# Patient Record
Sex: Female | Born: 1965 | Race: White | Hispanic: Refuse to answer | Smoking: Never smoker
Health system: Western US, Academic
[De-identification: ages and names within clinical notes are randomized; demographics above are authoritative.]

## PROBLEM LIST (undated history)

## (undated) DIAGNOSIS — Z8669 Personal history of other diseases of the nervous system and sense organs: Secondary | ICD-10-CM

## (undated) DIAGNOSIS — I10 Essential (primary) hypertension: Secondary | ICD-10-CM

## (undated) DIAGNOSIS — K219 Gastro-esophageal reflux disease without esophagitis: Secondary | ICD-10-CM

## (undated) DIAGNOSIS — J020 Streptococcal pharyngitis: Secondary | ICD-10-CM

## (undated) DIAGNOSIS — F32A Depression, unspecified: Secondary | ICD-10-CM

## (undated) DIAGNOSIS — F329 Major depressive disorder, single episode, unspecified: Secondary | ICD-10-CM

## (undated) DIAGNOSIS — F419 Anxiety disorder, unspecified: Secondary | ICD-10-CM

## (undated) DIAGNOSIS — D649 Anemia, unspecified: Secondary | ICD-10-CM

## (undated) DIAGNOSIS — Z853 Personal history of malignant neoplasm of breast: Secondary | ICD-10-CM

## (undated) DIAGNOSIS — K449 Diaphragmatic hernia without obstruction or gangrene: Secondary | ICD-10-CM

## (undated) DIAGNOSIS — Z86010 Personal history of colonic polyps: Secondary | ICD-10-CM

## (undated) DIAGNOSIS — J00 Acute nasopharyngitis [common cold]: Secondary | ICD-10-CM

## (undated) DIAGNOSIS — J45909 Unspecified asthma, uncomplicated: Secondary | ICD-10-CM

## (undated) DIAGNOSIS — K253 Acute gastric ulcer without hemorrhage or perforation: Secondary | ICD-10-CM

## (undated) HISTORY — DX: Major depressive disorder, single episode, unspecified: F32.9

## (undated) HISTORY — DX: Streptococcal pharyngitis: J02.0

## (undated) HISTORY — DX: Gastro-esophageal reflux disease without esophagitis: K21.9

## (undated) HISTORY — DX: Personal history of other diseases of the nervous system and sense organs: Z86.69

## (undated) HISTORY — DX: Anxiety disorder, unspecified: F41.9

## (undated) HISTORY — PX: TONSILLECTOMY: SUR1361

## (undated) HISTORY — DX: Depression, unspecified: F32.A

## (undated) HISTORY — DX: Essential (primary) hypertension: I10

## (undated) HISTORY — DX: Personal history of colon polyps: Z86.010

## (undated) HISTORY — DX: Anemia, unspecified: D64.9

## (undated) HISTORY — DX: Personal history of malignant neoplasm of breast: Z85.3

## (undated) HISTORY — DX: Acute nasopharyngitis (common cold): J00

## (undated) HISTORY — DX: Unspecified asthma, uncomplicated: J45.909

## (undated) HISTORY — DX: Diaphragmatic hernia without obstruction or gangrene: K44.9

## (undated) HISTORY — DX: Acute gastric ulcer without hemorrhage or perforation: K25.3

---

## 2001-12-09 ENCOUNTER — Encounter: Admission: RE | Admit: 2001-12-09 | Discharge: 2001-12-09 | Payer: Self-pay | Admitting: Family Medicine

## 2001-12-09 ENCOUNTER — Encounter: Payer: Self-pay | Admitting: Family Medicine

## 2005-06-02 ENCOUNTER — Encounter: Admission: RE | Admit: 2005-06-02 | Discharge: 2005-06-02 | Payer: Self-pay | Admitting: Family Medicine

## 2005-06-08 ENCOUNTER — Encounter: Admission: RE | Admit: 2005-06-08 | Discharge: 2005-06-08 | Payer: Self-pay | Admitting: Family Medicine

## 2006-02-12 ENCOUNTER — Encounter: Admission: RE | Admit: 2006-02-12 | Discharge: 2006-02-12 | Payer: Self-pay | Admitting: Obstetrics and Gynecology

## 2007-09-07 ENCOUNTER — Emergency Department (HOSPITAL_BASED_OUTPATIENT_CLINIC_OR_DEPARTMENT_OTHER): Admission: EM | Admit: 2007-09-07 | Discharge: 2007-09-07 | Payer: Self-pay | Admitting: Emergency Medicine

## 2010-02-09 ENCOUNTER — Encounter: Payer: Self-pay | Admitting: Family Medicine

## 2012-05-24 ENCOUNTER — Other Ambulatory Visit: Payer: Self-pay | Admitting: Family Medicine

## 2012-07-21 ENCOUNTER — Other Ambulatory Visit: Payer: Self-pay | Admitting: Family Medicine

## 2012-08-22 ENCOUNTER — Other Ambulatory Visit: Payer: Self-pay | Admitting: Family Medicine

## 2012-09-06 ENCOUNTER — Other Ambulatory Visit: Payer: Self-pay | Admitting: Family Medicine

## 2012-09-20 ENCOUNTER — Ambulatory Visit (INDEPENDENT_AMBULATORY_CARE_PROVIDER_SITE_OTHER): Payer: PRIVATE HEALTH INSURANCE | Admitting: Family Medicine

## 2012-09-20 ENCOUNTER — Encounter: Payer: Self-pay | Admitting: Family Medicine

## 2012-09-20 VITALS — BP 105/69 | HR 118 | Resp 16 | Ht 59.0 in | Wt 130.0 lb

## 2012-09-20 DIAGNOSIS — K219 Gastro-esophageal reflux disease without esophagitis: Secondary | ICD-10-CM

## 2012-09-20 DIAGNOSIS — L708 Other acne: Secondary | ICD-10-CM

## 2012-09-20 DIAGNOSIS — F3289 Other specified depressive episodes: Secondary | ICD-10-CM

## 2012-09-20 DIAGNOSIS — F411 Generalized anxiety disorder: Secondary | ICD-10-CM

## 2012-09-20 DIAGNOSIS — L709 Acne, unspecified: Secondary | ICD-10-CM

## 2012-09-20 DIAGNOSIS — F32A Depression, unspecified: Secondary | ICD-10-CM

## 2012-09-20 DIAGNOSIS — G43909 Migraine, unspecified, not intractable, without status migrainosus: Secondary | ICD-10-CM

## 2012-09-20 DIAGNOSIS — J309 Allergic rhinitis, unspecified: Secondary | ICD-10-CM

## 2012-09-20 DIAGNOSIS — F329 Major depressive disorder, single episode, unspecified: Secondary | ICD-10-CM

## 2012-09-20 MED ORDER — SUMATRIPTAN SUCCINATE 6 MG/0.5ML ~~LOC~~ SOTJ: SUBCUTANEOUS | Status: DC

## 2012-09-20 MED ORDER — NADOLOL 20 MG PO TABS: 20.0000 mg | ORAL_TABLET | Freq: Every day | ORAL | Status: DC

## 2012-09-20 MED ORDER — ESCITALOPRAM OXALATE 20 MG PO TABS: 20.0000 mg | ORAL_TABLET | Freq: Every day | ORAL | Status: DC

## 2012-09-20 MED ORDER — OMEPRAZOLE-SODIUM BICARBONATE 40-1100 MG PO CAPS: 1.0000 | ORAL_CAPSULE | Freq: Every day | ORAL | Status: DC

## 2012-09-20 MED ORDER — MAGNESIUM OXIDE 400 MG PO TABS: 400.0000 mg | ORAL_TABLET | Freq: Every day | ORAL | Status: DC

## 2012-09-20 MED ORDER — FLUTICASONE PROPIONATE 50 MCG/ACT NA SUSP: 2.0000 | Freq: Every day | NASAL | Status: DC

## 2012-09-20 MED ORDER — TRETINOIN 0.1 % EX CREA: TOPICAL_CREAM | Freq: Every day | CUTANEOUS | Status: DC

## 2012-09-20 MED ORDER — SUMATRIPTAN SUCCINATE 100 MG PO TABS: 100.0000 mg | ORAL_TABLET | ORAL | Status: DC | PRN

## 2012-09-20 MED ORDER — WELLBUTRIN XL 300 MG PO TB24: 300.0000 mg | ORAL_TABLET | ORAL | Status: DC

## 2012-09-20 NOTE — Progress Notes (Signed)
  Subjective:    Patient ID: Claire Goodwin, female    DOB: Sep 23, 1965, 47 y.o.   MRN: [id number]  HPI  Claire Goodwin is here today to discuss the conditions listed below:      1)  Anxiety:  She continues taking 1/2 pill of her Lexapro 20 mg and Wellbutrin 300 mg daily.  She would like to get both medications refilled.    2)  Migraine Headaches:  Her headaches are controlled with her magnesium 400 mg and Imitrex 100 mg as needed.  She needs a refill on both medications.      Review of Systems  Constitutional: Negative.   Eyes: Negative.   Respiratory: Negative.   Cardiovascular: Negative.   Gastrointestinal: Negative.   Endocrine: Negative.   Genitourinary: Negative.   Musculoskeletal: Negative.   Skin: Negative.   Allergic/Immunologic: Negative.   Neurological: Positive for headaches.  Hematological: Negative.   Psychiatric/Behavioral: Negative.      Past Medical History  Diagnosis Date  . History of migraine headaches   . Depression   . Anxiety   . Strep throat      Family History  Problem Relation Age of Onset  . Cancer Mother     Breast Cancer  . Cancer Maternal Grandmother     Cervical and Colon Cancer      History   Social History Narrative   Marital Status:  Married Engineer, maintenance (IT))   Children:  G4 P3013  Thurston Hole, Margaretmary Bayley)   Pets:  None    Living Situation: Lives with spouse and children   Occupation:  Pensions consultant    Education: Engineer, maintenance (IT) [employer] ([employer])    Tobacco Use/Exposure:  None    Alcohol Use:  Occasional   Drug Use:  None   Diet:  Regular   Exercise: Treadmill (3-5 x per week)    Hobbies: Reading        Objective:   Physical Exam  Vitals reviewed. Constitutional: She is oriented to person, place, and time. She appears well-developed and well-nourished.  Cardiovascular: Normal rate and regular rhythm.   Pulmonary/Chest: Effort normal and breath sounds normal.  Neurological: She is alert and oriented to person, place, and time.   Skin: Skin is warm and dry.  Psychiatric: She has a normal mood and affect.        Assessment & Plan:

## 2012-09-21 MED ORDER — KETOROLAC TROMETHAMINE 60 MG/2ML IM SOLN
60.0000 mg | Freq: Once | INTRAMUSCULAR | Status: AC
  Administered 2012-09-20: 60 mg via INTRAMUSCULAR

## 2012-09-21 MED ORDER — METHYLPREDNISOLONE SODIUM SUCC 125 MG IJ SOLR
125.0000 mg | Freq: Once | INTRAMUSCULAR | Status: AC
  Administered 2012-09-20: 125 mg via INTRAMUSCULAR

## 2012-11-25 ENCOUNTER — Ambulatory Visit (INDEPENDENT_AMBULATORY_CARE_PROVIDER_SITE_OTHER): Payer: PRIVATE HEALTH INSURANCE | Admitting: Family Medicine

## 2012-11-25 VITALS — BP 128/83 | HR 76 | Temp 97.8°F | Resp 16 | Wt 131.0 lb

## 2012-11-25 DIAGNOSIS — R070 Pain in throat: Secondary | ICD-10-CM

## 2012-11-25 DIAGNOSIS — K1370 Unspecified lesions of oral mucosa: Secondary | ICD-10-CM

## 2012-11-25 DIAGNOSIS — K121 Other forms of stomatitis: Secondary | ICD-10-CM

## 2012-11-25 DIAGNOSIS — Z23 Encounter for immunization: Secondary | ICD-10-CM

## 2012-11-25 LAB — POCT RAPID STREP A (OFFICE): Rapid Strep A Screen: NEGATIVE

## 2012-11-25 MED ORDER — FAMCICLOVIR 500 MG PO TABS: ORAL_TABLET | ORAL | Status: DC

## 2012-11-25 MED ORDER — DOXYCYCLINE HYCLATE 50 MG PO CAPS: 50.0000 mg | ORAL_CAPSULE | Freq: Two times a day (BID) | ORAL | Status: DC

## 2012-11-25 MED ORDER — TRIAMCINOLONE ACETONIDE 0.1 % MT PSTE: 1.0000 "application " | PASTE | Freq: Three times a day (TID) | OROMUCOSAL | Status: DC

## 2012-11-25 NOTE — Progress Notes (Signed)
  Subjective:    Patient ID: Claire Goodwin, female    DOB: 1965-09-02, 47 y.o.   MRN: [id number]  Claire Goodwin is here today complaining of a sore throat.   Sore Throat  This is a new problem. The current episode started today. The problem has been gradually worsening. The pain is worse on the left side. There has been no fever. The pain is at a severity of 4/10. The pain is moderate. She has tried nothing for the symptoms.     Review of Systems  Constitutional: Negative.   HENT: Positive for sore throat.   Eyes: Negative.   Respiratory: Negative.   Cardiovascular: Negative.   Gastrointestinal: Negative.   Endocrine: Negative.   Genitourinary: Negative.   Musculoskeletal: Negative.   Skin: Negative.   Allergic/Immunologic: Negative.   Neurological: Negative.   Hematological: Negative.   Psychiatric/Behavioral: Negative.      Past Medical History  Diagnosis Date  . History of migraine headaches   . Depression   . Anxiety   . Strep throat      Family History  Problem Relation Age of Onset  . Cancer Mother     Breast Cancer  . 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 No distress.  HENT:  Head: Normocephalic.  Small ulcer on the left side of oropharynx.   Eyes: Conjunctivae are normal. Right eye exhibits no discharge. Left eye exhibits no discharge.  Neck: Neck supple.  Cardiovascular: Normal rate, regular rhythm and normal heart sounds.  Exam  reveals no gallop and no friction rub.   No murmur heard. Pulmonary/Chest: Effort normal and breath sounds normal. She has no wheezes. She exhibits no tenderness.  Lymphadenopathy:    She has no cervical adenopathy.  Neurological: She is alert.  Skin: Skin is warm and dry. No rash noted.  Psychiatric: She has a normal mood and affect.      Assessment & Plan:    Claire Goodwin was seen today for sore throat.  Diagnoses and associated orders for this visit:  Throat pain - POCT rapid strep A (Negative)   Oral ulcer - famciclovir (FAMVIR) 500 MG tablet; Take 3 tabs at onset of symptoms x 1 day - triamcinolone (KENALOG) 0.1 % paste; Use as directed 1 application in the mouth or throat 3 (three) times daily. - doxycycline (VIBRAMYCIN) 50 MG capsule; Take 1 capsule (50 mg total) by mouth 2 (two) times daily.  Need for prophylactic vaccination and inoculation against influenza - Flu Vaccine QUAD 36+ mos PF IM (Fluarix)

## 2012-11-25 NOTE — Patient Instructions (Signed)
1)  Mouth Ulcer - Gargle with Duke's Magic Mouthwash and apply the steroid dental paste.  If you start to develop more you can try the Famvir (viral) and /or try the Doxycycline twice a day.    Oral Ulcers Oral ulcers are painful, shallow sores around the lining of the mouth. They can affect the gums, the inside of the lips and the cheeks (sores on the outside of the lips and on the face are different). They typically first occur in school aged children and teenagers. Oral ulcers may also be called canker sores or cold sores. CAUSES  Canker sores and cold sores can be caused by many factors including:  Infection.  Injury.  Sun exposure.  Medications.  Emotional stress.  Food allergies.  Vitamin deficiencies.  Toothpastes containing sodium lauryl sulfate. The Herpes Virus can be the cause of mouth ulcers. The first infection can be severe and cause 10 or more ulcers on the gums, tongue and lips with fever and difficulty in swallowing. This infection usually occurs between the ages of 1 and 3 years.  SYMPTOMS  The typical sore is about  inch (6 mm) in size, is an oval or round ulcer with red borders. DIAGNOSIS  Your caregiver can diagnose simple oral ulcers by examination. Additional testing is usually not required.  TREATMENT  Treatment is aimed at pain relief. Generally, oral ulcers resolve by themselves within 1 to 2 weeks without medication and are not contagious unless caused by Herpes (and other viruses). Antibiotics are not effective with mouth sores. Avoid direct contact with others until the ulcer is completely healed. See your caregiver for follow-up care as recommended. Also:  Offer a soft diet.  Encourage plenty of fluids to prevent dehydration. Popsicles and milk shakes can be helpful.  Avoid acidic and salty foods and drinks such as orange juice.  Infants and young children will often refuse to drink because of pain. Using a teaspoon, cup or syringe to give small  amounts of fluids frequently can help prevent dehydration.  Cold compresses on the face may help reduce pain.  Pain medication can help control soreness.  A solution of diphenhydramine mixed with a liquid antacid can be useful to decrease the soreness of ulcers. Consult a caregiver for the dosing.  Liquids or ointments with a numbing ingredient may be helpful when used as recommended.  Older children and teenagers can rinse their mouth with a salt-water mixture (1/2 teaspoonof salt in 8 ounces of water) four times a day. This treatment is uncomfortable but may reduce the time the ulcers are present.  There are many over the counter throat lozenges and medications available for oral ulcers. There effectiveness has not been studied.  Consult your medical caregiver prior to using homeopathic treatments for oral ulcers. SEEK MEDICAL CARE IF:   You think your child needs to be seen.  The pain worsens and you cannot control it.  There are 4 or more ulcers.  The lips and gums begin to bleed and crust.  A single mouth ulcer is near a tooth that is causing a toothache or pain.  Your child has a fever, swollen face, or swollen glands.  The ulcers began after starting a medication.  Mouth ulcers keep re-occurring or last more than 2 weeks.  You think your child is not taking adequate fluids. SEEK IMMEDIATE MEDICAL CARE IF:   Your child has a high fever.  Your child is unable to swallow or becomes dehydrated.  Your child looks  or acts very ill.  An ulcer caused by a chemical your child accidentally put in their mouth. Document Released: 02/13/2004 Document Revised: 03/30/2011 Document Reviewed: 09/27/2008 [facility] Patient Information 2014 [place], [place].

## 2012-11-26 DIAGNOSIS — F32A Depression, unspecified: Secondary | ICD-10-CM | POA: Insufficient documentation

## 2012-11-26 DIAGNOSIS — K219 Gastro-esophageal reflux disease without esophagitis: Secondary | ICD-10-CM | POA: Insufficient documentation

## 2012-11-26 DIAGNOSIS — J309 Allergic rhinitis, unspecified: Secondary | ICD-10-CM | POA: Insufficient documentation

## 2012-11-26 DIAGNOSIS — L709 Acne, unspecified: Secondary | ICD-10-CM | POA: Insufficient documentation

## 2012-11-26 DIAGNOSIS — F329 Major depressive disorder, single episode, unspecified: Secondary | ICD-10-CM | POA: Insufficient documentation

## 2012-11-26 DIAGNOSIS — G43909 Migraine, unspecified, not intractable, without status migrainosus: Secondary | ICD-10-CM | POA: Insufficient documentation

## 2012-11-26 DIAGNOSIS — F411 Generalized anxiety disorder: Secondary | ICD-10-CM | POA: Insufficient documentation

## 2012-11-26 DIAGNOSIS — G43109 Migraine with aura, not intractable, without status migrainosus: Secondary | ICD-10-CM | POA: Insufficient documentation

## 2012-11-26 NOTE — Assessment & Plan Note (Signed)
Refilled her Zegerid.

## 2012-11-26 NOTE — Assessment & Plan Note (Signed)
Refilled her Flonase.

## 2012-11-26 NOTE — Assessment & Plan Note (Signed)
Refilled her Lexapro.   

## 2012-11-26 NOTE — Assessment & Plan Note (Signed)
Refilled her nadolol, magnesium and Imitrex.  She was also given injections of Solu-Medrol and Toradol at today's visit to help break the migraine she has been suffering from over the past few days.

## 2012-11-26 NOTE — Assessment & Plan Note (Signed)
Refilled her Wellbutrin.   

## 2012-11-26 NOTE — Assessment & Plan Note (Signed)
Refilled her Retin A.

## 2013-01-16 LAB — HM MAMMOGRAPHY: HM Mammogram: NEGATIVE

## 2013-01-18 ENCOUNTER — Encounter: Payer: Self-pay | Admitting: *Deleted

## 2013-01-22 ENCOUNTER — Encounter: Payer: Self-pay | Admitting: Family Medicine

## 2013-01-22 DIAGNOSIS — R070 Pain in throat: Secondary | ICD-10-CM | POA: Insufficient documentation

## 2013-01-22 DIAGNOSIS — K121 Other forms of stomatitis: Secondary | ICD-10-CM | POA: Insufficient documentation

## 2013-01-22 DIAGNOSIS — Z23 Encounter for immunization: Secondary | ICD-10-CM | POA: Insufficient documentation

## 2013-03-13 ENCOUNTER — Other Ambulatory Visit: Payer: Self-pay | Admitting: *Deleted

## 2013-03-13 DIAGNOSIS — Z0000 Encounter for general adult medical examination without abnormal findings: Secondary | ICD-10-CM

## 2013-03-14 ENCOUNTER — Other Ambulatory Visit: Payer: PRIVATE HEALTH INSURANCE

## 2013-03-15 ENCOUNTER — Other Ambulatory Visit: Payer: PRIVATE HEALTH INSURANCE

## 2013-03-15 LAB — LIPID PANEL
Cholesterol: 189 mg/dL (ref 0–200)
HDL: 28 mg/dL — ABNORMAL LOW (ref >39)
LDL Cholesterol: 117 mg/dL — ABNORMAL HIGH (ref 0–99)
Total CHOL/HDL Ratio: 6.8 Ratio
Triglycerides: 221 mg/dL — ABNORMAL HIGH (ref <150)
VLDL: 44 mg/dL — ABNORMAL HIGH (ref 0–40)

## 2013-03-15 LAB — CBC WITH DIFFERENTIAL/PLATELET
Basophils Absolute: 0 10*3/uL (ref 0.0–0.1)
Basophils Relative: 0 % (ref 0–1)
Eosinophils Absolute: 0.3 10*3/uL (ref 0.0–0.7)
Eosinophils Relative: 3 % (ref 0–5)
HCT: 39.4 % (ref 36.0–46.0)
Hemoglobin: 13.5 g/dL (ref 12.0–15.0)
Lymphocytes Relative: 33 % (ref 12–46)
Lymphs Abs: 3.1 10*3/uL (ref 0.7–4.0)
MCH: 30.1 pg (ref 26.0–34.0)
MCHC: 34.3 g/dL (ref 30.0–36.0)
MCV: 87.8 fL (ref 78.0–100.0)
Monocytes Absolute: 0.6 10*3/uL (ref 0.1–1.0)
Monocytes Relative: 6 % (ref 3–12)
Neutro Abs: 5.4 10*3/uL (ref 1.7–7.7)
Neutrophils Relative %: 58 % (ref 43–77)
Platelets: 368 10*3/uL (ref 150–400)
RBC: 4.49 MIL/uL (ref 3.87–5.11)
RDW: 13.1 % (ref 11.5–15.5)
WBC: 9.3 10*3/uL (ref 4.0–10.5)

## 2013-03-15 LAB — COMPLETE METABOLIC PANEL WITH GFR
ALT: 31 U/L (ref 0–35)
AST: 21 U/L (ref 0–37)
Albumin: 3.8 g/dL (ref 3.5–5.2)
Alkaline Phosphatase: 38 U/L — ABNORMAL LOW (ref 39–117)
BUN: 12 mg/dL (ref 6–23)
CO2: 22 mEq/L (ref 19–32)
Calcium: 8.8 mg/dL (ref 8.4–10.5)
Chloride: 107 mEq/L (ref 96–112)
Creat: 0.67 mg/dL (ref 0.50–1.10)
GFR, Est African American: >89 mL/min
GFR, Est Non African American: >89 mL/min
Glucose, Bld: 82 mg/dL (ref 70–99)
Potassium: 4.4 mEq/L (ref 3.5–5.3)
Sodium: 138 mEq/L (ref 135–145)
Total Bilirubin: 0.4 mg/dL (ref 0.2–1.2)
Total Protein: 6.1 g/dL (ref 6.0–8.3)

## 2013-03-16 LAB — TSH: TSH: 1.224 u[IU]/mL (ref 0.350–4.500)

## 2013-03-21 ENCOUNTER — Ambulatory Visit (INDEPENDENT_AMBULATORY_CARE_PROVIDER_SITE_OTHER): Payer: PRIVATE HEALTH INSURANCE | Admitting: Family Medicine

## 2013-03-21 ENCOUNTER — Encounter: Payer: Self-pay | Admitting: Family Medicine

## 2013-03-21 VITALS — BP 113/70 | HR 72 | Resp 16 | Ht 59.0 in | Wt 127.0 lb

## 2013-03-21 DIAGNOSIS — L709 Acne, unspecified: Secondary | ICD-10-CM

## 2013-03-21 DIAGNOSIS — R059 Cough, unspecified: Secondary | ICD-10-CM

## 2013-03-21 DIAGNOSIS — R05 Cough: Secondary | ICD-10-CM

## 2013-03-21 DIAGNOSIS — R0989 Other specified symptoms and signs involving the circulatory and respiratory systems: Secondary | ICD-10-CM

## 2013-03-21 DIAGNOSIS — L708 Other acne: Secondary | ICD-10-CM

## 2013-03-21 MED ORDER — MUPIROCIN 2 % EX OINT: TOPICAL_OINTMENT | CUTANEOUS | Status: DC

## 2013-03-21 MED ORDER — HYDROCOD POLST-CHLORPHEN POLST 10-8 MG/5ML PO LQCR: 5.0000 mL | Freq: Two times a day (BID) | ORAL | Status: DC | PRN

## 2013-03-21 MED ORDER — BENZONATATE 200 MG PO CAPS: 200.0000 mg | ORAL_CAPSULE | Freq: Three times a day (TID) | ORAL | Status: DC | PRN

## 2013-03-21 MED ORDER — AZITHROMYCIN 500 MG PO TABS: 500.0000 mg | ORAL_TABLET | Freq: Every day | ORAL | Status: DC

## 2013-03-21 NOTE — Patient Instructions (Addendum)
1)  Head Congestion - Zyrtec D twice a day  2)  Cough/Chest Congestion - Mucinex DM 1200/60 twice a day plus Tessalon Perles plus Tussionex 1 tsp at night; Umcka Cold Care - 2 droppers 3-4 x per day.       Cough, Adult  A cough is a reflex that helps clear your throat and airways. It can help heal the body or may be a reaction to an irritated airway. A cough may only last 2 or 3 weeks (acute) or may last more than 8 weeks (chronic).  CAUSES Acute cough:  Viral or bacterial infections. Chronic cough:  Infections.  Allergies.  Asthma.  Post-nasal drip.  Smoking.  Heartburn or acid reflux.  Some medicines.  Chronic lung problems (COPD).  Cancer. SYMPTOMS   Cough.  Fever.  Chest pain.  Increased breathing rate.  High-pitched whistling sound when breathing (wheezing).  Colored mucus that you cough up (sputum). TREATMENT   A bacterial cough may be treated with antibiotic medicine.  A viral cough must run its course and will not respond to antibiotics.  Your caregiver may recommend other treatments if you have a chronic cough. HOME CARE INSTRUCTIONS   Only take over-the-counter or prescription medicines for pain, discomfort, or fever as directed by your caregiver. Use cough suppressants only as directed by your caregiver.  Use a cold steam vaporizer or humidifier in your bedroom or home to help loosen secretions.  Sleep in a semi-upright position if your cough is worse at night.  Rest as needed.  Stop smoking if you smoke. SEEK IMMEDIATE MEDICAL CARE IF:   You have pus in your sputum.  Your cough starts to worsen.  You cannot control your cough with suppressants and are losing sleep.  You begin coughing up blood.  You have difficulty breathing.  You develop pain which is getting worse or is uncontrolled with medicine.  You have a fever. MAKE SURE YOU:   Understand these instructions.  Will watch your condition.  Will get help right away if  you are not doing well or get worse. Document Released: 07/04/2010 Document Revised: 03/30/2011 Document Reviewed: 07/04/2010 [facility] Patient Information 2014 [place]

## 2013-03-21 NOTE — Progress Notes (Signed)
Subjective:    Patient ID: Claire Goodwin, female    DOB: Feb 11, 1965, 48 y.o.   MRN: [id number]  HPI  Claire Goodwin is here today to discuss her lab result and to get an  insurance form completed for her health insurance.  She would also like to discuss the URI symptoms she has been having for the past 3 weeks.  She has struggled with head and chest congestion along with a cough.  She has taken/used  Mucinex, nasal spray and pseudoephedrine which as not helped.  She feels that she is just not getting better.    Review of Systems  HENT: Positive for congestion, rhinorrhea, sinus pressure and voice change.   Respiratory: Positive for cough.   Neurological: Positive for dizziness. Negative for headaches.  Psychiatric/Behavioral: The patient is not nervous/anxious.   All other systems reviewed and are negative.    Past Medical History  Diagnosis Date  . History of migraine headaches   . Depression   . Anxiety   . Strep throat   . Hypertension   . GERD (gastroesophageal reflux disease)      Past Surgical History  Procedure Laterality Date  . Tonsillectomy    . 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 Cancer Mother     Breast Cancer  . Breast cancer Mother   . Cancer Maternal Grandmother     Cervical and Colon Cancer      Current Outpatient Prescriptions on File Prior to Visit  Medication Sig Dispense Refill  . fluticasone (FLONASE) 50 MCG/ACT nasal spray Place 2 sprays into the nose daily.  17 g  11  .  Levonorgestrel-Ethinyl Estradiol (SEASONIQUE) 0.15-0.03 &0.01 MG tablet Take 1 tablet by mouth daily.      . magnesium oxide (MAG-OX) 400 MG tablet Take 1 tablet (400 mg total) by mouth daily.  30 tablet  11  . nadolol (CORGARD) 20 MG tablet Take 1 tablet (20 mg total) by mouth daily.  30 tablet  11  . omeprazole-sodium bicarbonate (ZEGERID) 40-1100 MG per capsule Take 1 capsule by mouth daily before breakfast.  30 capsule  11  . SUMAtriptan (IMITREX) 100 MG tablet Take 1 tablet (100 mg total) by mouth every 2 (two) hours as needed for migraine.  9 tablet  11  . SUMAtriptan Succinate 6 MG/0.5ML SOTJ Use 1 device in place of a pill as directed for migraine headache.  6 Device  11  . tretinoin (RETIN-A) 0.1 % cream Apply topically at bedtime.  45 g  11   No current facility-administered medications on file prior to visit.     Allergies  Allergen Reactions  . Sulfa Antibiotics Rash     Immunization History  Administered Date(s) Administered  . Influenza,inj,Quad PF,36+ Mos 11/25/2012  . MMR 09/11/2004  . Td 09/11/2004        Objective:  Physical Exam  Nursing note and vitals reviewed. Constitutional: She is oriented to person, place, and time. She appears well-developed and well-nourished. No distress.  HENT:  Head: Normocephalic.  Mouth/Throat: No oropharyngeal exudate.  Eyes: Conjunctivae are normal. Right eye exhibits no discharge. Left eye exhibits no discharge.  Neck: Neck supple.  Cardiovascular: Normal rate, regular rhythm and normal heart sounds.  Exam reveals no gallop and no friction rub.   No murmur heard. Pulmonary/Chest: Effort normal and breath sounds normal. She has no wheezes. She exhibits no tenderness.  Lymphadenopathy:    She has no cervical adenopathy.  Neurological: She is alert and oriented to person, place, and time.  Skin: Skin is warm and dry. No rash noted.  Acne is present on face.    Psychiatric: She has a normal mood and affect.        Assessment & Plan:    Claire Goodwin was seen today for uri.  Diagnoses and associated orders for this visit:  Cough - benzonatate (TESSALON) 200 MG capsule; Take 1 capsule (200 mg total) by mouth 3 (three) times daily as needed for cough. - chlorpheniramine-HYDROcodone (TUSSIONEX PENNKINETIC ER) 10-8 MG/5ML LQCR; Take 5 mLs by mouth every 12 (twelve) hours as needed. - azithromycin (ZITHROMAX) 500 MG tablet; Take 1 tablet (500 mg total) by mouth daily.  Chest congestion  Acne - mupirocin ointment (BACTROBAN) 2 %; Apply to skin BID

## 2013-03-25 ENCOUNTER — Other Ambulatory Visit: Payer: Self-pay | Admitting: Family Medicine

## 2013-03-27 ENCOUNTER — Other Ambulatory Visit: Payer: Self-pay | Admitting: *Deleted

## 2013-03-27 DIAGNOSIS — F411 Generalized anxiety disorder: Secondary | ICD-10-CM

## 2013-03-27 DIAGNOSIS — F329 Major depressive disorder, single episode, unspecified: Secondary | ICD-10-CM

## 2013-03-27 DIAGNOSIS — F32A Depression, unspecified: Secondary | ICD-10-CM

## 2013-03-27 MED ORDER — WELLBUTRIN XL 300 MG PO TB24: 300.0000 mg | ORAL_TABLET | ORAL | Status: DC

## 2013-03-27 MED ORDER — ESCITALOPRAM OXALATE 20 MG PO TABS: 20.0000 mg | ORAL_TABLET | Freq: Every day | ORAL | Status: DC

## 2013-08-22 ENCOUNTER — Encounter: Payer: Self-pay | Admitting: Family Medicine

## 2013-08-22 ENCOUNTER — Ambulatory Visit (INDEPENDENT_AMBULATORY_CARE_PROVIDER_SITE_OTHER): Payer: PRIVATE HEALTH INSURANCE | Admitting: Family Medicine

## 2013-08-22 ENCOUNTER — Other Ambulatory Visit: Payer: Self-pay | Admitting: Family Medicine

## 2013-08-22 VITALS — BP 104/71 | HR 66 | Resp 16 | Ht 59.0 in | Wt 130.0 lb

## 2013-08-22 DIAGNOSIS — G43909 Migraine, unspecified, not intractable, without status migrainosus: Secondary | ICD-10-CM

## 2013-08-22 DIAGNOSIS — L708 Other acne: Secondary | ICD-10-CM

## 2013-08-22 DIAGNOSIS — R110 Nausea: Secondary | ICD-10-CM

## 2013-08-22 DIAGNOSIS — F32A Depression, unspecified: Secondary | ICD-10-CM

## 2013-08-22 DIAGNOSIS — I10 Essential (primary) hypertension: Secondary | ICD-10-CM

## 2013-08-22 DIAGNOSIS — L700 Acne vulgaris: Secondary | ICD-10-CM

## 2013-08-22 DIAGNOSIS — F329 Major depressive disorder, single episode, unspecified: Secondary | ICD-10-CM

## 2013-08-22 DIAGNOSIS — F3289 Other specified depressive episodes: Secondary | ICD-10-CM

## 2013-08-22 DIAGNOSIS — K219 Gastro-esophageal reflux disease without esophagitis: Secondary | ICD-10-CM

## 2013-08-22 DIAGNOSIS — F411 Generalized anxiety disorder: Secondary | ICD-10-CM

## 2013-08-22 DIAGNOSIS — J3089 Other allergic rhinitis: Secondary | ICD-10-CM

## 2013-08-22 MED ORDER — PROMETHAZINE HCL 25 MG PO TABS: 25.0000 mg | ORAL_TABLET | Freq: Three times a day (TID) | ORAL | Status: DC | PRN

## 2013-08-22 MED ORDER — OMEPRAZOLE-SODIUM BICARBONATE 40-1100 MG PO CAPS: 1.0000 | ORAL_CAPSULE | Freq: Every day | ORAL | Status: DC

## 2013-08-22 MED ORDER — SUMATRIPTAN SUCCINATE 100 MG PO TABS: 100.0000 mg | ORAL_TABLET | ORAL | Status: DC | PRN

## 2013-08-22 MED ORDER — NADOLOL 20 MG PO TABS: 20.0000 mg | ORAL_TABLET | Freq: Every day | ORAL | Status: DC

## 2013-08-22 MED ORDER — MAGNESIUM OXIDE 400 MG PO TABS: 400.0000 mg | ORAL_TABLET | Freq: Every day | ORAL | Status: AC

## 2013-08-22 MED ORDER — TRETINOIN 0.1 % EX CREA: TOPICAL_CREAM | Freq: Every day | CUTANEOUS | Status: AC

## 2013-08-22 MED ORDER — SUMATRIPTAN SUCCINATE 6 MG/0.5ML ~~LOC~~ SOTJ: SUBCUTANEOUS | Status: DC

## 2013-08-22 MED ORDER — ESCITALOPRAM OXALATE 20 MG PO TABS: 20.0000 mg | ORAL_TABLET | Freq: Every day | ORAL | Status: DC

## 2013-08-22 MED ORDER — FLUTICASONE PROPIONATE 50 MCG/ACT NA SUSP: 2.0000 | Freq: Every day | NASAL | Status: AC

## 2013-08-22 MED ORDER — SUMATRIPTAN-NAPROXEN SODIUM 85-500 MG PO TABS: 1.0000 | ORAL_TABLET | ORAL | Status: DC | PRN

## 2013-08-22 MED ORDER — BUPROPION HCL ER (XL) 300 MG PO TB24: 300.0000 mg | ORAL_TABLET | ORAL | Status: DC

## 2013-08-22 NOTE — Progress Notes (Signed)
Subjective:    Patient ID: Claire Goodwin, female    DOB: [birth year], 48 y.o.   MRN: [id number]  HPI  Claire Goodwin is here today to get medication refills. She has been doing well since her last visit.  1)  Anxiety:  Her mood is good on the combination of Lexapro and Wellbutrin.    2)  Migraines:  She would like to get a refill on her Treximet and Imitrex.   3)  GERD:  Her reflux is controlled with Zegrid.  4)  Hypertension:  She is doing well on the nadolol.     Review of Systems  Constitutional: Negative for activity change, appetite change, fatigue and unexpected weight change.  HENT: Negative.   Eyes: Negative.   Respiratory: Negative for shortness of breath.   Cardiovascular: Negative for chest pain, palpitations and leg swelling.  Gastrointestinal: Negative for diarrhea and constipation.  Endocrine: Negative.   Genitourinary: Negative for difficulty urinating.  Musculoskeletal: Negative.   Skin: Negative.   Neurological: Negative.  Negative for headaches.  Hematological: Negative for adenopathy. Does not bruise/bleed easily.  Psychiatric/Behavioral: Negative for behavioral problems, sleep disturbance and dysphoric mood. The patient is not nervous/anxious.   All other systems reviewed and are negative.    Past Medical History  Diagnosis Date  . History of migraine headaches   . Depression   . Anxiety   . Strep throat   . Hypertension   . GERD (gastroesophageal reflux disease)      Past Surgical History  Procedure Laterality Date  . Tonsillectomy    . 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 Cancer Mother     Breast Cancer  . Breast cancer Mother   . Cancer Maternal Grandmother     Cervical and Colon Cancer      Current Outpatient Prescriptions on File Prior to Visit  Medication Sig Dispense Refill  . Levonorgestrel-Ethinyl Estradiol (SEASONIQUE) 0.15-0.03 &0.01 MG tablet Take 1 tablet by mouth daily.       No current facility-administered medications on file prior to visit.     Allergies  Allergen Reactions  . Sulfa Antibiotics Rash     Immunization History  Administered Date(s) Administered  . Influenza,inj,Quad PF,36+ Mos 11/25/2012  . MMR 09/11/2004  . Td 09/11/2004       Objective:   Physical Exam  Vitals reviewed. Constitutional: She is oriented to person, place, and time. She appears well-developed and well-nourished. No distress.  HENT:  Head: Normocephalic and atraumatic.  Mouth/Throat: Oropharynx is clear and moist.  Eyes: Conjunctivae and EOM are normal. Pupils are equal, round, and reactive to light. Right eye exhibits no discharge. Left eye exhibits no discharge. No scleral icterus.  Neck: Normal range of motion. Neck supple. No thyromegaly present.  Cardiovascular: Normal rate, regular rhythm and normal heart sounds.   Pulmonary/Chest: Effort normal and breath  sounds normal. No respiratory distress.  Abdominal: There is no tenderness.  Musculoskeletal: She exhibits no edema and no tenderness.  Lymphadenopathy:    She has no cervical adenopathy.  Neurological: She is alert and oriented to person, place, and time.  Skin: Skin is warm and dry.  Psychiatric: She has a normal mood and affect.      Assessment & Plan:    Claire Goodwin was seen today for medication management and medical management of chronic issues.  Her symptoms are controlled on her current medications so they were refilled.    Diagnoses and associated orders for this  visit:  Essential hypertension, benign - nadolol (CORGARD) 20 MG tablet; Take 1 tablet (20 mg total) by mouth daily.  Anxiety state, unspecified - escitalopram (LEXAPRO) 20 MG tablet; Take 1 tablet (20 mg total) by mouth daily.  Gastroesophageal reflux disease without esophagitis - omeprazole-sodium bicarbonate (ZEGERID) 40-1100 MG per capsule; Take 1 capsule by mouth daily before breakfast.  Depression - buPROPion (WELLBUTRIN XL) 300 MG 24 hr tablet; Take 1 tablet (300 mg total) by mouth every morning.  Acne vulgaris - tretinoin (RETIN-A) 0.1 % cream; Apply topically at bedtime.  Migraine, unspecified, without mention of intractable migraine without mention of status migrainosus - SUMAtriptan-naproxen (TREXIMET) 85-500 MG per tablet; Take 1 tablet by mouth every 2 (two) hours as needed for migraine. - SUMAtriptan Succinate 6 MG/0.5ML SOTJ; Use 1 device in place of a pill as directed for migraine headache. - SUMAtriptan (IMITREX) 100 MG tablet; Take 1 tablet (100 mg total) by mouth every 2 (two) hours as needed for migraine. - nadolol (CORGARD) 20 MG tablet; Take 1 tablet (20 mg total) by mouth daily. - magnesium oxide (MAG-OX) 400 MG tablet; Take 1 tablet (400 mg total) by mouth daily.  Other allergic rhinitis - fluticasone (FLONASE) 50 MCG/ACT nasal spray; Place 2 sprays into both nostrils daily.  Nausea alone - promethazine (PHENERGAN) 25 MG tablet; Take 1 tablet (25 mg total) by mouth every 8 (eight) hours as needed for nausea or vomiting.   TIME SPENT "FACE TO FACE" WITH PATIENT -  40 MINS

## 2013-08-22 NOTE — Patient Instructions (Addendum)
Insomnia Insomnia is frequent trouble falling and/or staying asleep. Insomnia can be a long term problem or a short term problem. Both are common. Insomnia can be a short term problem when the wakefulness is related to a certain stress or worry. Long term insomnia is often related to ongoing stress during waking hours and/or poor sleeping habits. Overtime, sleep deprivation itself can make the problem worse. Every little thing feels more severe because you are overtired and your ability to cope is decreased. CAUSES   Stress, anxiety, and depression.  Poor sleeping habits.  Distractions such as TV in the bedroom.  Naps close to bedtime.  Engaging in emotionally charged conversations before bed.  Technical reading before sleep.  Alcohol and other sedatives. They may make the problem worse. They can hurt normal sleep patterns and normal dream activity.  Stimulants such as caffeine for several hours prior to bedtime.  Pain syndromes and shortness of breath can cause insomnia.  Exercise late at night.  Changing time zones may cause sleeping problems (jet lag). It is sometimes helpful to have someone observe your sleeping patterns. They should look for periods of not breathing during the night (sleep apnea). They should also look to see how long those periods last. If you live alone or observers are uncertain, you can also be observed at a sleep clinic where your sleep patterns will be professionally monitored. Sleep apnea requires a checkup and treatment. Give your caregivers your medical history. Give your caregivers observations your family has made about your sleep.  SYMPTOMS   Not feeling rested in the morning.  Anxiety and restlessness at bedtime.  Difficulty falling and staying asleep. TREATMENT   Your caregiver may prescribe treatment for an underlying medical disorders. Your caregiver can give advice or help if you are using alcohol or other drugs for self-medication. Treatment  of underlying problems will usually eliminate insomnia problems.  Medications can be prescribed for short time use. They are generally not recommended for lengthy use.  Over-the-counter sleep medicines are not recommended for lengthy use. They can be habit forming.  You can promote easier sleeping by making lifestyle changes such as:  Using relaxation techniques that help with breathing and reduce muscle tension.  Exercising earlier in the day.  Changing your diet and the time of your last meal. No night time snacks.  Establish a regular time to go to bed.  Counseling can help with stressful problems and worry.  Soothing music and white noise may be helpful if there are background noises you cannot remove.  Stop tedious detailed work at least one hour before bedtime. HOME CARE INSTRUCTIONS   Keep a diary. Inform your caregiver about your progress. This includes any medication side effects. See your caregiver regularly. Take note of:  Times when you are asleep.  Times when you are awake during the night.  The quality of your sleep.  How you feel the next day. This information will help your caregiver care for you.  Get out of bed if you are still awake after 15 minutes. Read or do some quiet activity. Keep the lights down. Wait until you feel sleepy and go back to bed.  Keep regular sleeping and waking hours. Avoid naps.  Exercise regularly.  Avoid distractions at bedtime. Distractions include watching television or engaging in any intense or detailed activity like attempting to balance the household checkbook.  Develop a bedtime ritual. Keep a familiar routine of bathing, brushing your teeth, climbing into bed at the same   time each night, listening to soothing music. Routines increase the success of falling to sleep faster.  Use relaxation techniques. This can be using breathing and muscle tension release routines. It can also include visualizing peaceful scenes. You can  also help control troubling or intruding thoughts by keeping your mind occupied with boring or repetitive thoughts like the old concept of counting sheep. You can make it more creative like imagining planting one beautiful flower after another in your backyard garden.  During your day, work to eliminate stress. When this is not possible use some of the previous suggestions to help reduce the anxiety that accompanies stressful situations. MAKE SURE YOU:   Understand these instructions.  Will watch your condition.  Will get help right away if you are not doing well or get worse. Document Released: 01/03/2000 Document Revised: 03/30/2011 Document Reviewed: 02/02/2007 Exit[employer] Patient Information 2015 [employer]. This information is not intended to replace advice given to you by your health care provider. Make sure you discuss any questions you have with your health care provider.  

## 2013-09-23 ENCOUNTER — Other Ambulatory Visit: Payer: Self-pay | Admitting: Family Medicine

## 2013-09-28 ENCOUNTER — Other Ambulatory Visit: Payer: Self-pay | Admitting: Family Medicine

## 2017-02-18 ENCOUNTER — Other Ambulatory Visit (INDEPENDENT_AMBULATORY_CARE_PROVIDER_SITE_OTHER): Payer: Self-pay | Admitting: Otolaryngology

## 2017-02-18 DIAGNOSIS — J329 Chronic sinusitis, unspecified: Secondary | ICD-10-CM

## 2018-06-09 ENCOUNTER — Encounter (HOSPITAL_COMMUNITY): Payer: Self-pay

## 2018-06-09 ENCOUNTER — Emergency Department (HOSPITAL_COMMUNITY): Payer: PRIVATE HEALTH INSURANCE

## 2018-06-09 ENCOUNTER — Inpatient Hospital Stay (HOSPITAL_COMMUNITY)
Admission: EM | Admit: 2018-06-09 | Discharge: 2018-06-12 | DRG: 418 | Disposition: A | Discharge status: Home / Self Care | Payer: PRIVATE HEALTH INSURANCE | Attending: Surgery | Admitting: Surgery

## 2018-06-09 ENCOUNTER — Other Ambulatory Visit: Payer: Self-pay

## 2018-06-09 DIAGNOSIS — K8066 Calculus of gallbladder and bile duct with acute and chronic cholecystitis without obstruction: Secondary | ICD-10-CM | POA: Diagnosis not present

## 2018-06-09 DIAGNOSIS — Z419 Encounter for procedure for purposes other than remedying health state, unspecified: Secondary | ICD-10-CM

## 2018-06-09 DIAGNOSIS — F329 Major depressive disorder, single episode, unspecified: Secondary | ICD-10-CM | POA: Diagnosis present

## 2018-06-09 DIAGNOSIS — K420 Umbilical hernia with obstruction, without gangrene: Secondary | ICD-10-CM | POA: Diagnosis present

## 2018-06-09 DIAGNOSIS — K8010 Calculus of gallbladder with chronic cholecystitis without obstruction: Secondary | ICD-10-CM | POA: Diagnosis present

## 2018-06-09 DIAGNOSIS — K838 Other specified diseases of biliary tract: Secondary | ICD-10-CM

## 2018-06-09 DIAGNOSIS — Z7951 Long term (current) use of inhaled steroids: Secondary | ICD-10-CM

## 2018-06-09 DIAGNOSIS — Z882 Allergy status to sulfonamides status: Secondary | ICD-10-CM

## 2018-06-09 DIAGNOSIS — E785 Hyperlipidemia, unspecified: Secondary | ICD-10-CM | POA: Diagnosis present

## 2018-06-09 DIAGNOSIS — R748 Abnormal levels of other serum enzymes: Secondary | ICD-10-CM | POA: Diagnosis present

## 2018-06-09 DIAGNOSIS — J302 Other seasonal allergic rhinitis: Secondary | ICD-10-CM | POA: Diagnosis present

## 2018-06-09 DIAGNOSIS — R1011 Right upper quadrant pain: Secondary | ICD-10-CM | POA: Diagnosis not present

## 2018-06-09 DIAGNOSIS — Z98891 History of uterine scar from previous surgery: Secondary | ICD-10-CM

## 2018-06-09 DIAGNOSIS — Z881 Allergy status to other antibiotic agents status: Secondary | ICD-10-CM

## 2018-06-09 DIAGNOSIS — K5900 Constipation, unspecified: Secondary | ICD-10-CM | POA: Diagnosis present

## 2018-06-09 DIAGNOSIS — F419 Anxiety disorder, unspecified: Secondary | ICD-10-CM | POA: Diagnosis present

## 2018-06-09 DIAGNOSIS — K8020 Calculus of gallbladder without cholecystitis without obstruction: Secondary | ICD-10-CM

## 2018-06-09 DIAGNOSIS — K219 Gastro-esophageal reflux disease without esophagitis: Secondary | ICD-10-CM | POA: Diagnosis present

## 2018-06-09 DIAGNOSIS — Z1159 Encounter for screening for other viral diseases: Secondary | ICD-10-CM

## 2018-06-09 DIAGNOSIS — Z79899 Other long term (current) drug therapy: Secondary | ICD-10-CM

## 2018-06-09 DIAGNOSIS — I10 Essential (primary) hypertension: Secondary | ICD-10-CM | POA: Diagnosis present

## 2018-06-09 DIAGNOSIS — K8050 Calculus of bile duct without cholangitis or cholecystitis without obstruction: Secondary | ICD-10-CM | POA: Diagnosis present

## 2018-06-09 LAB — COMPREHENSIVE METABOLIC PANEL
ALT: 53 U/L — ABNORMAL HIGH (ref 0–44)
AST: 88 U/L — ABNORMAL HIGH (ref 15–41)
Albumin: 3.8 g/dL (ref 3.5–5.0)
Alkaline Phosphatase: 73 U/L (ref 38–126)
Anion gap: 8 (ref 5–15)
BUN: 13 mg/dL (ref 6–20)
CO2: 21 mmol/L — ABNORMAL LOW (ref 22–32)
Calcium: 8.5 mg/dL — ABNORMAL LOW (ref 8.9–10.3)
Chloride: 109 mmol/L (ref 98–111)
Creatinine, Ser: 0.86 mg/dL (ref 0.44–1.00)
GFR calc Af Amer: >60 mL/min (ref >60)
GFR calc non Af Amer: >60 mL/min (ref >60)
Glucose, Bld: 90 mg/dL (ref 70–99)
Potassium: 3.7 mmol/L (ref 3.5–5.1)
Sodium: 138 mmol/L (ref 135–145)
Total Bilirubin: 1.5 mg/dL — ABNORMAL HIGH (ref 0.3–1.2)
Total Protein: 6.5 g/dL (ref 6.5–8.1)

## 2018-06-09 LAB — CBC WITH DIFFERENTIAL/PLATELET
Abs Immature Granulocytes: 0.04 10*3/uL (ref 0.00–0.07)
Basophils Absolute: 0.1 10*3/uL (ref 0.0–0.1)
Basophils Relative: 1 %
Eosinophils Absolute: 0.2 10*3/uL (ref 0.0–0.5)
Eosinophils Relative: 2 %
HCT: 42.1 % (ref 36.0–46.0)
Hemoglobin: 13.7 g/dL (ref 12.0–15.0)
Immature Granulocytes: 0 %
Lymphocytes Relative: 17 %
Lymphs Abs: 2 10*3/uL (ref 0.7–4.0)
MCH: 30.6 pg (ref 26.0–34.0)
MCHC: 32.5 g/dL (ref 30.0–36.0)
MCV: 94.2 fL (ref 80.0–100.0)
Monocytes Absolute: 1 10*3/uL (ref 0.1–1.0)
Monocytes Relative: 8 %
Neutro Abs: 8.1 10*3/uL — ABNORMAL HIGH (ref 1.7–7.7)
Neutrophils Relative %: 72 %
Platelets: 386 10*3/uL (ref 150–400)
RBC: 4.47 MIL/uL (ref 3.87–5.11)
RDW: 12.7 % (ref 11.5–15.5)
WBC: 11.3 10*3/uL — ABNORMAL HIGH (ref 4.0–10.5)
nRBC: 0 % (ref 0.0–0.2)

## 2018-06-09 LAB — URINALYSIS, ROUTINE W REFLEX MICROSCOPIC
Bilirubin Urine: NEGATIVE
Glucose, UA: NEGATIVE mg/dL
Hgb urine dipstick: NEGATIVE
Ketones, ur: NEGATIVE mg/dL
Leukocytes,Ua: NEGATIVE
Nitrite: NEGATIVE
Protein, ur: NEGATIVE mg/dL
Specific Gravity, Urine: 1.014 (ref 1.005–1.030)
pH: 7 (ref 5.0–8.0)

## 2018-06-09 LAB — SARS CORONAVIRUS 2 BY RT PCR (HOSPITAL ORDER, PERFORMED IN ~~LOC~~ HOSPITAL LAB): SARS Coronavirus 2: NEGATIVE

## 2018-06-09 LAB — LIPASE, BLOOD: Lipase: 27 U/L (ref 11–51)

## 2018-06-09 LAB — POC URINE PREG, ED: Preg Test, Ur: NEGATIVE

## 2018-06-09 MED ORDER — KCL IN DEXTROSE-NACL 20-5-0.45 MEQ/L-%-% IV SOLN
INTRAVENOUS | Status: DC
  Administered 2018-06-10 – 2018-06-11 (×4): via INTRAVENOUS
  Filled 2018-06-09 (×5): qty 1000

## 2018-06-09 MED ORDER — OXYCODONE HCL 5 MG PO TABS
5.0000 mg | ORAL_TABLET | ORAL | Status: DC | PRN
  Administered 2018-06-10 – 2018-06-11 (×4): 10 mg via ORAL
  Filled 2018-06-09 (×4): qty 2

## 2018-06-09 MED ORDER — HYDROMORPHONE HCL 1 MG/ML IJ SOLN: 1.0000 mg | INTRAMUSCULAR | Status: DC | PRN

## 2018-06-09 MED ORDER — SODIUM CHLORIDE 0.9% FLUSH: 3.0000 mL | Freq: Once | INTRAVENOUS | Status: DC

## 2018-06-09 MED ORDER — ONDANSETRON 4 MG PO TBDP
4.0000 mg | ORAL_TABLET | Freq: Four times a day (QID) | ORAL | Status: DC | PRN
  Administered 2018-06-11: 4 mg via ORAL
  Filled 2018-06-09: qty 1

## 2018-06-09 MED ORDER — ACETAMINOPHEN 325 MG PO TABS: 650.0000 mg | ORAL_TABLET | Freq: Four times a day (QID) | ORAL | Status: DC | PRN

## 2018-06-09 MED ORDER — ACETAMINOPHEN 650 MG RE SUPP: 650.0000 mg | Freq: Four times a day (QID) | RECTAL | Status: DC | PRN

## 2018-06-09 MED ORDER — SODIUM CHLORIDE 0.9 % IV BOLUS
1000.0000 mL | Freq: Once | INTRAVENOUS | Status: AC
  Administered 2018-06-09: 1000 mL via INTRAVENOUS

## 2018-06-09 MED ORDER — ONDANSETRON HCL 4 MG/2ML IJ SOLN
4.0000 mg | Freq: Four times a day (QID) | INTRAMUSCULAR | Status: DC | PRN
  Administered 2018-06-10 (×2): 4 mg via INTRAVENOUS
  Filled 2018-06-09: qty 2

## 2018-06-09 NOTE — ED Provider Notes (Signed)
[facility]-EMERGENCY DEPT Provider Note   CSN: [id number] Arrival date & time: 06/09/18  1747    History   Chief Complaint Chief Complaint  Patient presents with   Abdominal Pain    HPI Claire Goodwin is a 53 y.o. female.     HPI   53 year old female with a history of hyperlipidemia, depression, anxiety, migraines, presents with concern for epigastric and right upper quadrant abdominal pain which has been intermittent after eating since Thanksgiving, but became constant tonight today after having lunch.  Reports that since Thanksgiving, nearly every time she is had food, she will develop epigastric and right upper quadrant abdominal pain.  Reports that it will usually self resolved.  She has stopped NSAIDs and considered that it could be gastritis.  She has not seen a physician regarding the symptoms.  The pain returned this afternoon after lunch, she called her primary care physician who was going to schedule an outpatient ultrasound, however her pain continued and worsened and she came to the emergency department for further care.  Describes it as a severe pain, 8-9 out of 10, located in the epigastrium with radiation to the right upper quadrant, left upper quadrant, and right shoulder blade.  Reports associated nausea.  Reports the pain was severe, she was pacing in the waiting room, and leaning over a sofa at home to help alleviate the pain.  When she was in the triage room chair, she felt the pain suddenly improved, and now describes it as a 3 out of 10.  In the past, she has had vomiting with episodes but has not had any today.  Reports she is also had some diarrhea occasionally in the past, but has not had any today.  Reports that she had more frequent migraines which she suspects might be secondary to dehydration, but does not have a headache today.  Reports fatigue over the last 2 months and occasional chills.  Denies smoking or drugs, reports occasional alcohol  use.  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  Past Medical History:  Diagnosis Date   Anxiety    Depression    GERD (gastroesophageal reflux disease)    History of migraine headaches    Hypertension    Strep throat     Patient Active Problem List   Diagnosis Date Noted   Throat pain 01/22/2013   Need for prophylactic vaccination and inoculation against influenza 01/22/2013   Oral ulcer 01/22/2013   Anxiety state, unspecified 11/26/2012   Depression 11/26/2012   Migraine, unspecified, without mention of intractable migraine without mention of status migrainosus 11/26/2012   Allergic rhinitis 11/26/2012   GERD (gastroesophageal reflux disease) 11/26/2012   Acne 11/26/2012    Past Surgical History:  Procedure Laterality Date   CESAREAN SECTION     TONSILLECTOMY       OB History   No obstetric history on file.      Home Medications    Prior to Admission medications   Medication Sig Start Date End Date Taking? Authorizing Provider  buPROPion (WELLBUTRIN XL) 300 MG 24 hr tablet Take 300 mg by mouth daily.   Yes [provider]  escitalopram (LEXAPRO) 20 MG tablet Take 1 tablet (20 mg total) by mouth daily. Patient taking differently: Take 20 mg by mouth at bedtime.  08/22/13 06/09/18 Yes Zanard, Hinton Dyerobyn K, MD  fexofenadine-pseudoephedrine (ALLEGRA-D 24) 180-240 MG 24 hr tablet Take 1 tablet by mouth daily as needed (allergies).   Yes [provider]  fluticasone (FLONASE) 50 MCG/ACT nasal spray Place 2 sprays into both nostrils daily. 08/22/13 06/09/18 Yes Zanard, Hinton Dyer, MD  Levonorgestrel-Ethinyl Estradiol (SEASONIQUE) 0.15-0.03 &0.01 MG tablet Take 1 tablet by mouth daily.   Yes [provider]  promethazine (PHENERGAN) 25 MG tablet Take 1 tablet (25 mg total) by mouth every 8 (eight) hours as needed for nausea or vomiting. 08/22/13  Yes Zanard, Hinton Dyer, MD  rizatriptan (MAXALT) 10 MG tablet Take 10 mg by mouth as needed for migraine. May  repeat in 2 hours if needed   Yes [provider]  zonisamide (ZONEGRAN) 100 MG capsule Take 300 mg by mouth daily.   Yes [provider]  nadolol (CORGARD) 20 MG tablet Take 1 tablet (20 mg total) by mouth daily. Patient not taking: Reported on 06/09/2018 08/22/13 08/22/14  Gillian Scarce, MD  omeprazole-sodium bicarbonate (ZEGERID) 40-1100 MG per capsule Take 1 capsule by mouth daily before breakfast. Patient not taking: Reported on 06/09/2018 08/22/13 08/22/14  Gillian Scarce, MD  SUMAtriptan (IMITREX) 100 MG tablet Take 1 tablet (100 mg total) by mouth every 2 (two) hours as needed for migraine. Patient not taking: Reported on 06/09/2018 08/22/13 08/23/14  Gillian Scarce, MD  SUMAtriptan Succinate 6 MG/0.5ML SOTJ Use 1 device in place of a pill as directed for migraine headache. Patient not taking: Reported on 06/09/2018 08/22/13 08/22/14  Zanard, Hinton Dyer, MD  SUMAtriptan-naproxen (TREXIMET) 85-500 MG per tablet Take 1 tablet by mouth every 2 (two) hours as needed for migraine. Patient not taking: Reported on 06/09/2018 08/22/13 08/23/14  Gillian Scarce, MD    Family History Family History  Problem Relation Age of Onset   Cancer Mother        Breast Cancer   Breast cancer Mother    Cancer Maternal Grandmother        Cervical and Colon Cancer     Social History Social History   Tobacco Use   Smoking status: Never Smoker   Smokeless tobacco: Never Used  Substance Use Topics   Alcohol use: Yes    Comment: Occasional   Drug use: No     Allergies   Cephalosporins and Sulfa antibiotics   Review of Systems Review of Systems  Constitutional: Positive for chills and fatigue. Negative for fever.  Eyes: Negative for visual disturbance.  Respiratory: Negative for cough and shortness of breath.   Cardiovascular: Negative for chest pain.  Gastrointestinal: Positive for abdominal pain and nausea. Negative for diarrhea and vomiting.  Genitourinary: Negative for difficulty  urinating, dysuria and hematuria.  Musculoskeletal: Positive for back pain. Negative for neck pain.  Skin: Negative for rash.  Neurological: Negative for syncope and headaches.     Physical Exam Updated Vital Signs BP 133/89    Pulse 96    Temp 98.4 F (36.9 C) (Oral)    Resp 16    Ht  (1.499 m)    Wt 49.9 kg    SpO2 100%    BMI 22.22 kg/m   Physical Exam Vitals signs and nursing note reviewed.  Constitutional:      General: She is not in acute distress.    Appearance: She is well-developed. She is not diaphoretic.  HENT:     Head: Normocephalic and atraumatic.  Eyes:     Conjunctiva/sclera: Conjunctivae normal.  Neck:     Musculoskeletal: Normal range of motion.  Cardiovascular:     Rate and Rhythm: Normal rate and regular rhythm.  Heart sounds: Normal heart sounds. No murmur. No friction rub. No gallop.   Pulmonary:     Effort: Pulmonary effort is normal. No respiratory distress.     Breath sounds: Normal breath sounds. No wheezing or rales.  Abdominal:     General: There is no distension.     Palpations: Abdomen is soft.     Tenderness: There is abdominal tenderness (mild RLQ discomfort but more significant pain in RUQ) in the right upper quadrant. There is guarding. There is no right CVA tenderness. Positive signs include Murphy's sign. Negative signs include McBurney's sign.  Musculoskeletal:        General: No tenderness.  Skin:    General: Skin is warm and dry.     Findings: No erythema or rash.  Neurological:     Mental Status: She is alert and oriented to person, place, and time.      ED Treatments / Results  Labs (all labs ordered are listed, but only abnormal results are displayed) Labs Reviewed  COMPREHENSIVE METABOLIC PANEL - Abnormal; Notable for the following components:      Result Value   CO2 21 (*)    Calcium 8.5 (*)    AST 88 (*)    ALT 53 (*)    Total Bilirubin 1.5 (*)    All other components within normal limits  URINALYSIS,  ROUTINE W REFLEX MICROSCOPIC - Abnormal; Notable for the following components:   APPearance CLOUDY (*)    All other components within normal limits  CBC WITH DIFFERENTIAL/PLATELET - Abnormal; Notable for the following components:   WBC 11.3 (*)    Neutro Abs 8.1 (*)    All other components within normal limits  SARS CORONAVIRUS 2 (HOSPITAL ORDER, PERFORMED IN [facility])  LIPASE, BLOOD  POC URINE PREG, ED    EKG None  Radiology US Abdomen Limited Ruq  Result Date: 06/09/2018 CLINICAL DATA:  Initial evaluation for acute right upper quadrant abdominal pain. EXAM: ULTRASOUND ABDOMEN LIMITED RIGHT UPPER QUADRANT COMPARISON:  None. FINDINGS: Gallbladder: Multiple small layering echogenic stones present within the gallbladder lumen. Gallbladder wall measure within normal limits at 2.5 mm. No free pericholecystic fluid. No sonographic Murphy sign elicited on exam. Common bile duct: Diameter: 8.5 mm.  No visible choledocholithiasis. Liver: No focal lesion identified. Within normal limits in parenchymal echogenicity. Portal vein is patent on color Doppler imaging with normal direction of blood flow towards the liver. IMPRESSION: 1. Cholelithiasis, with no other sonographic features to suggest acute cholecystitis. 2. Dilatation of the common bile duct up to 8.5 mm. No visible choledocholithiasis by sonography. Electronically Signed   By: Rise Mu M.D.   On: 06/09/2018 20:06    Procedures Procedures (including critical care time)  Medications Ordered in ED Medications  sodium chloride flush (NS) 0.9 % injection 3 mL (has no administration in time range)  sodium chloride 0.9 % bolus 1,000 mL (1,000 mLs Intravenous New Bag/Given 06/09/18 1917)     Initial Impression / Assessment and Plan / ED Course  I have reviewed the triage vital signs and the nursing notes.  Pertinent labs & imaging results that were available during my care of the patient were reviewed by me and  considered in my medical decision making (see chart for details).       53 year old female with a history of hyperlipidemia, depression, anxiety, migraines, presents with concern for epigastric and right upper quadrant abdominal pain which has been intermittent after eating since Thanksgiving, but became  constant tonight today after having lunch.  DDx includes cholecystitis, cholelithiasis, pancreatitis, gastritis, appendicitis.  History and exam are most consistent with symptomatic cholelithiasis or cholecystitis.  CBC, CMP, lipase, UA, and RUQ [place] ordered and pending.  Given IV fluid, declines pain or nausea medication at this time.  CMP shows very mild elevation in transaminases and bilirubin. WBC mildly elevated.   RUQ US shows cholelithiasis and CBD dilated to 8.5.  Discussed with Dr. Marca Ancona of GI given ?choledocolithiasis who recommends  Consulting surgery given history more consistent with symptomatic cholelithiasis, consider surgery and cholangiogram.  Discussed with Dr. Gerrit Friends who will admit the patient for further care.   Final Clinical Impressions(s) / ED Diagnoses   Final diagnoses:  RUQ abdominal pain  Symptomatic cholelithiasis  Common bile duct dilatation    ED Discharge Orders    None       Alvira Monday, MD 06/09/18 2130

## 2018-06-09 NOTE — H&P (Signed)
Claire Goodwin is an 53 y.o. female.    General Surgery [facility]  Chief Complaint: abdominal pain, cholecystitis, cholithiasis  HPI: Patient is a 53 year old female who presents to the emergency department with unrelenting upper abdominal pain.  Patient has experienced similar symptoms intermittently since the fall 2019.  Episodes present with pain above the level of the umbilicus and radiating into the right upper quadrant of the abdomen and into the back.  They are associated with nausea.  Patient denies any emesis.  Episodes last for approximately 2 hours and then resolved.  Today the patient developed similar type abdominal pain but it became more severe.  It has lasted longer than her normal attack.  She presented to the emergency department for evaluation.  Ultrasound demonstrates multiple small gallstones.  There is no sign of infection.  There is mild dilatation of the common bile duct at 8.5 mm.  Laboratory studies show a minimally elevated white blood cell count.  Transaminases are slightly elevated.  Total bilirubin is 1.5.  Alkaline phosphatase is normal.  Patient has had prior cesarean section.  She has had no other abdominal surgery.  There is no family history of gallbladder disease.  Patient denies any history of hepatobiliary or pancreatic disease.  She denies jaundice or acholic stools.  Patient does have irregular bowel movements with frequent diarrhea and constipation.  Past Medical History:  Diagnosis Date  . Anxiety   . Depression   . GERD (gastroesophageal reflux disease)   . History of migraine headaches   . Hypertension   . Strep throat     Past Surgical History:  Procedure Laterality Date  . CESAREAN SECTION    . TONSILLECTOMY      Family History  Problem Relation Age of Onset  . Cancer Mother        Breast Cancer  . Breast cancer Mother   . Cancer Maternal Grandmother        Cervical and Colon Cancer    Social History:  reports that  she has never smoked. She has never used smokeless tobacco. She reports current alcohol use. She reports that she does not use drugs.  Allergies:  Allergies  Allergen Reactions  . Cephalosporins Rash  . Sulfa Antibiotics Rash    (Not in a hospital admission)   Results for orders placed or performed during the hospital encounter of 06/09/18 (from the past 48 hour(s))  Urinalysis, Routine w reflex microscopic     Status: Abnormal   Collection Time: 06/09/18  6:21 PM  Result Value Ref Range   Color, Urine YELLOW YELLOW   APPearance CLOUDY (A) CLEAR   Specific Gravity, Urine 1.014 1.005 - 1.030   pH 7.0 5.0 - 8.0   Glucose, UA NEGATIVE NEGATIVE mg/dL   Hgb urine dipstick NEGATIVE NEGATIVE   Bilirubin Urine NEGATIVE NEGATIVE   Ketones, ur NEGATIVE NEGATIVE mg/dL   Protein, ur NEGATIVE NEGATIVE mg/dL   Nitrite NEGATIVE NEGATIVE   Leukocytes,Ua NEGATIVE NEGATIVE    Comment: Performed at [facility], [address], [place], [place] [address]  Lipase, blood     Status: None   Collection Time: 06/09/18  6:58 PM  Result Value Ref Range   Lipase 27 11 - 51 U/L    Comment: Performed at [facility], [address] [place], [place] [address]  Comprehensive metabolic panel     Status: Abnormal   Collection Time: 06/09/18  6:58 PM  Result Value Ref  Range   Sodium 138 135 - 145 mmol/L   Potassium 3.7 3.5 - 5.1 mmol/L   Chloride 109 98 - 111 mmol/L   CO2 21 (L) 22 - 32 mmol/L   Glucose, Bld 90 70 - 99 mg/dL   BUN 13 6 - 20 mg/dL   Creatinine, Ser 0.05 0.44 - 1.00 mg/dL   Calcium 8.5 (L) 8.9 - 10.3 mg/dL   Total Protein 6.5 6.5 - 8.1 g/dL   Albumin 3.8 3.5 - 5.0 g/dL   AST 88 (H) 15 - 41 U/L   ALT 53 (H) 0 - 44 U/L   Alkaline Phosphatase 73 38 - 126 U/L   Total Bilirubin 1.5 (H) 0.3 - 1.2 mg/dL   GFR calc non Af Amer >60 >60 mL/min   GFR calc Af Amer >60 >60 mL/min   Anion gap 8 5 - 15    Comment: Performed at [facility],  [address] [place], [place] [address]  CBC with Differential     Status: Abnormal   Collection Time: 06/09/18  6:58 PM  Result Value Ref Range   WBC 11.3 (H) 4.0 - 10.5 K/uL   RBC 4.47 3.87 - 5.11 MIL/uL   Hemoglobin 13.7 12.0 - 15.0 g/dL   HCT 11.7 35.6 - 70.1 %   MCV 94.2 80.0 - 100.0 fL   MCH 30.6 26.0 - 34.0 pg   MCHC 32.5 30.0 - 36.0 g/dL   RDW 41.0 30.1 - 31.4 %   Platelets 386 150 - 400 K/uL   nRBC 0.0 0.0 - 0.2 %   Neutrophils Relative % 72 %   Neutro Abs 8.1 (H) 1.7 - 7.7 K/uL   Lymphocytes Relative 17 %   Lymphs Abs 2.0 0.7 - 4.0 K/uL   Monocytes Relative 8 %   Monocytes Absolute 1.0 0.1 - 1.0 K/uL   Eosinophils Relative 2 %   Eosinophils Absolute 0.2 0.0 - 0.5 K/uL   Basophils Relative 1 %   Basophils Absolute 0.1 0.0 - 0.1 K/uL   Immature Granulocytes 0 %   Abs Immature Granulocytes 0.04 0.00 - 0.07 K/uL    Comment: Performed at [facility], [address], [place], [place] [address]  POC Urine Pregnancy, ED (not at [facility])     Status: None   Collection Time: 06/09/18  7:56 PM  Result Value Ref Range   Preg Test, Ur NEGATIVE NEGATIVE    Comment:        THE SENSITIVITY OF THIS METHODOLOGY IS >24 mIU/mL    US Abdomen Limited Ruq  Result Date: 06/09/2018 CLINICAL DATA:  Initial evaluation for acute right upper quadrant abdominal pain. EXAM: ULTRASOUND ABDOMEN LIMITED RIGHT UPPER QUADRANT COMPARISON:  None. FINDINGS: Gallbladder: Multiple small layering echogenic stones present within the gallbladder lumen. Gallbladder wall measure within normal limits at 2.5 mm. No free pericholecystic fluid. No sonographic Murphy sign elicited on exam. Common bile duct: Diameter: 8.5 mm.  No visible choledocholithiasis. Liver: No focal lesion identified. Within normal limits in parenchymal echogenicity. Portal vein is patent on color Doppler imaging with normal direction of blood flow towards the liver. IMPRESSION: 1. Cholelithiasis, with no other sonographic  features to suggest acute cholecystitis. 2. Dilatation of the common bile duct up to 8.5 mm. No visible choledocholithiasis by sonography. Electronically Signed   By: Rise Mu M.D.   On: 06/09/2018 20:06    Review of Systems  Constitutional: Positive for chills. Negative for diaphoresis and fever.  HENT: Negative.  Eyes: Negative.   Respiratory: Negative.   Cardiovascular: Negative.   Gastrointestinal: Positive for abdominal pain, constipation, diarrhea and nausea.  Genitourinary: Negative.   Musculoskeletal: Negative.   Skin: Negative.   Neurological: Positive for headaches.  Endo/Heme/Allergies: Negative.   Psychiatric/Behavioral: Positive for depression. The patient is nervous/anxious.     Blood pressure 133/89, pulse 96, temperature 98.4 F (36.9 C), temperature source Oral, resp. rate 15, height 4\' 11"  (1.499 m), weight 49.9 kg, SpO2 100 %. Physical Exam  Constitutional: She is oriented to person, place, and time. She appears well-developed and well-nourished. No distress.  HENT:  Head: Normocephalic and atraumatic.  Right Ear: External ear normal.  Left Ear: External ear normal.  Mouth/Throat: No oropharyngeal exudate.  Eyes: Pupils are equal, round, and reactive to light. Conjunctivae are normal. No scleral icterus.  Neck: Normal range of motion. Neck supple. No tracheal deviation present. No thyromegaly present.  Cardiovascular: Normal rate, regular rhythm and normal heart sounds.  No murmur heard. Respiratory: Effort normal and breath sounds normal. No respiratory distress. She has no wheezes.  GI: Soft. She exhibits no distension and no mass. There is abdominal tenderness (RUQ). There is no rebound and no guarding.  Small umbilical hernia, not reducible, likely containing fat  Musculoskeletal: Normal range of motion.        General: No tenderness, deformity or edema.  Neurological: She is alert and oriented to person, place, and time.  Skin: Skin is warm  and dry. She is not diaphoretic.  Psychiatric: She has a normal mood and affect. Her behavior is normal.     Assessment/Plan Chronic cholecystitis, cholelithiasis, biliary colic, elevated LFT's  Patient will be admitted to the general surgery service.  We will manage her pain and nausea.  Patient will require laparoscopic cholecystectomy with intraoperative cholangiography.  If there is evidence of choledocholithiasis, she may require ERCP during this hospitalization.  Patient is admitted to the general surgical service.  She may have clear liquids until midnight.  We will make her n.p.o. after midnight in anticipation of surgery tomorrow morning.  Patient will be seen in the morning by Dr. Ovidio Kinavid Newman who will be the operating surgeon.  Patient and I discussed laparoscopic cholecystectomy briefly.  Dr. Ezzard StandingNewman will review the procedure in more detail tomorrow morning.  Darnell Levelodd Estefan Pattison, MD [facility] Office: [phone]    Darnell Levelodd Kari Kerth, MD 06/09/2018, 10:07 PM

## 2018-06-09 NOTE — ED Notes (Signed)
Admitting MD at bedside.

## 2018-06-09 NOTE — ED Notes (Signed)
ED TO INPATIENT HANDOFF REPORT  Name/Age/Gender Claire Goodwin 53 y.o. female  Code Status    Code Status Orders  (From admission, onward)         Start     Ordered   06/09/18 2215  Full code  Continuous     06/09/18 2217        Code Status History    This patient has a current code status but no historical code status.      Home/SNF/Other Home  Chief Complaint Abd pain  Level of Care/Admitting Diagnosis ED Disposition    ED Disposition Condition Comment   Admit  Hospital Area: [facility] [[id number]]  Level of Care: Med-Surg [16]  Covid Evaluation: N/A  Diagnosis: Cholelithiasis with chronic cholecystitis [1610960]  Admitting Physician: CCS, MD [[id number]]  Attending Physician: CCS, MD [[id number]]  PT Class (Do Not Modify): Observation [104]  PT Acc Code (Do Not Modify): Observation [10022]       Medical History Past Medical History:  Diagnosis Date  . Anxiety   . Depression   . GERD (gastroesophageal reflux disease)   . History of migraine headaches   . Hypertension   . Strep throat     Allergies Allergies  Allergen Reactions  . Cephalosporins Rash  . Sulfa Antibiotics Rash    IV Location/Drains/Wounds Patient Lines/Drains/Airways Status   Active Line/Drains/Airways    Name:   Placement date:   Placement time:   Site:   Days:   Peripheral IV 06/09/18 Right Antecubital   06/09/18    1916    Antecubital   less than 1          Labs/Imaging Results for orders placed or performed during the hospital encounter of 06/09/18 (from the past 48 hour(s))  Urinalysis, Routine w reflex microscopic     Status: Abnormal   Collection Time: 06/09/18  6:21 PM  Result Value Ref Range   Color, Urine YELLOW YELLOW   APPearance CLOUDY (A) CLEAR   Specific Gravity, Urine 1.014 1.005 - 1.030   pH 7.0 5.0 - 8.0   Glucose, UA NEGATIVE NEGATIVE mg/dL   Hgb urine dipstick NEGATIVE NEGATIVE   Bilirubin Urine NEGATIVE NEGATIVE   Ketones, ur NEGATIVE  NEGATIVE mg/dL   Protein, ur NEGATIVE NEGATIVE mg/dL   Nitrite NEGATIVE NEGATIVE   Leukocytes,Ua NEGATIVE NEGATIVE    Comment: Performed at [facility], [address] [place], [place] [address]  Lipase, blood     Status: None   Collection Time: 06/09/18  6:58 PM  Result Value Ref Range   Lipase 27 11 - 51 U/L    Comment: Performed at [facility], [address] [place], [place] [address]  Comprehensive metabolic panel     Status: Abnormal   Collection Time: 06/09/18  6:58 PM  Result Value Ref Range   Sodium 138 135 - 145 mmol/L   Potassium 3.7 3.5 - 5.1 mmol/L   Chloride 109 98 - 111 mmol/L   CO2 21 (L) 22 - 32 mmol/L   Glucose, Bld 90 70 - 99 mg/dL   BUN 13 6 - 20 mg/dL   Creatinine, Ser 4.78 0.44 - 1.00 mg/dL   Calcium 8.5 (L) 8.9 - 10.3 mg/dL   Total Protein 6.5 6.5 - 8.1 g/dL   Albumin 3.8 3.5 - 5.0 g/dL   AST 88 (H) 15 - 41 U/L   ALT 53 (H) 0 - 44 U/L   Alkaline Phosphatase 73 38 -  126 U/L   Total Bilirubin 1.5 (H) 0.3 - 1.2 mg/dL   GFR calc non Af Amer >60 >60 mL/min   GFR calc Af Amer >60 >60 mL/min   Anion gap 8 5 - 15    Comment: Performed at [facility], [address] [place], [place] [address]  CBC with Differential     Status: Abnormal   Collection Time: 06/09/18  6:58 PM  Result Value Ref Range   WBC 11.3 (H) 4.0 - 10.5 K/uL   RBC 4.47 3.87 - 5.11 MIL/uL   Hemoglobin 13.7 12.0 - 15.0 g/dL   HCT 60.442.1 54.036.0 - 98.146.0 %   MCV 94.2 80.0 - 100.0 fL   MCH 30.6 26.0 - 34.0 pg   MCHC 32.5 30.0 - 36.0 g/dL   RDW 19.112.7 47.811.5 - 29.515.5 %   Platelets 386 150 - 400 K/uL   nRBC 0.0 0.0 - 0.2 %   Neutrophils Relative % 72 %   Neutro Abs 8.1 (H) 1.7 - 7.7 K/uL   Lymphocytes Relative 17 %   Lymphs Abs 2.0 0.7 - 4.0 K/uL   Monocytes Relative 8 %   Monocytes Absolute 1.0 0.1 - 1.0 K/uL   Eosinophils Relative 2 %   Eosinophils Absolute 0.2 0.0 - 0.5 K/uL   Basophils Relative 1 %   Basophils Absolute 0.1 0.0 - 0.1  K/uL   Immature Granulocytes 0 %   Abs Immature Granulocytes 0.04 0.00 - 0.07 K/uL    Comment: Performed at [facility], [address] [place], [place] [address]  POC Urine Pregnancy, ED (not at [facility])     Status: None   Collection Time: 06/09/18  7:56 PM  Result Value Ref Range   Preg Test, Ur NEGATIVE NEGATIVE    Comment:        THE SENSITIVITY OF THIS METHODOLOGY IS >24 mIU/mL    [place] Abdomen Limited Ruq  Result Date: 06/09/2018 CLINICAL DATA:  Initial evaluation for acute right upper quadrant abdominal pain. EXAM: ULTRASOUND ABDOMEN LIMITED RIGHT UPPER QUADRANT COMPARISON:  None. FINDINGS: Gallbladder: Multiple small layering echogenic stones present within the gallbladder lumen. Gallbladder wall measure within normal limits at 2.5 mm. No free pericholecystic fluid. No sonographic Murphy sign elicited on exam. Common bile duct: Diameter: 8.5 mm.  No visible choledocholithiasis. Liver: No focal lesion identified. Within normal limits in parenchymal echogenicity. Portal vein is patent on color Doppler imaging with normal direction of blood flow towards the liver. IMPRESSION: 1. Cholelithiasis, with no other sonographic features to suggest acute cholecystitis. 2. Dilatation of the common bile duct up to 8.5 mm. No visible choledocholithiasis by sonography. Electronically Signed   By: Rise MuBenjamin  McClintock M.D.   On: 06/09/2018 20:06    Pending Labs Unresulted Labs (From admission, onward)    Start     Ordered   06/09/18 2215  HIV antibody (Routine Testing)  Once,   R     06/09/18 2217   06/09/18 2130  SARS Coronavirus 2 (CEPHEID - Performed in [facility] hospital lab), Hosp Order  (Asymptomatic Patients Labs)  Once,   R    Question:  Rule Out  Answer:  Yes   06/09/18 2129          Vitals/Pain Today's Vitals   06/09/18 2119 06/09/18 2124 06/09/18 2130 06/09/18 2200  BP:  133/89  (!) 142/74  Pulse: 95 96  95  Resp:  15  16  Temp:      TempSrc:  SpO2: 100%  100%  100%  Weight:      Height:      PainSc:   4      Isolation Precautions No active isolations  Medications Medications  sodium chloride flush (NS) 0.9 % injection 3 mL (has no administration in time range)  dextrose 5 % and 0.45 % NaCl with KCl 20 mEq/L infusion (has no administration in time range)  acetaminophen (TYLENOL) tablet 650 mg (has no administration in time range)    Or  acetaminophen (TYLENOL) suppository 650 mg (has no administration in time range)  oxyCODONE (Oxy IR/ROXICODONE) immediate release tablet 5-10 mg (has no administration in time range)  HYDROmorphone (DILAUDID) injection 1 mg (has no administration in time range)  ondansetron (ZOFRAN-ODT) disintegrating tablet 4 mg (has no administration in time range)    Or  ondansetron (ZOFRAN) injection 4 mg (has no administration in time range)  sodium chloride 0.9 % bolus 1,000 mL (0 mLs Intravenous Stopped 06/09/18 2239)    Mobility walks

## 2018-06-09 NOTE — ED Triage Notes (Signed)
Patient c/o mid upper and RUQ pain that radiates into the right mid back that started hurting today. Patient states she has had this issue before since 11/2017. Patient c/o nausea, but denies vomiting or diarrhea.

## 2018-06-10 ENCOUNTER — Encounter (HOSPITAL_COMMUNITY): Admission: EM | Disposition: A | Discharge status: Home / Self Care | Payer: Self-pay | Source: Home / Self Care

## 2018-06-10 ENCOUNTER — Observation Stay (HOSPITAL_COMMUNITY): Payer: PRIVATE HEALTH INSURANCE | Admitting: Certified Registered Nurse Anesthetist

## 2018-06-10 ENCOUNTER — Encounter (HOSPITAL_COMMUNITY): Payer: Self-pay | Admitting: Certified Registered Nurse Anesthetist

## 2018-06-10 ENCOUNTER — Observation Stay (HOSPITAL_COMMUNITY): Payer: PRIVATE HEALTH INSURANCE

## 2018-06-10 HISTORY — PX: UMBILICAL HERNIA REPAIR: SHX196

## 2018-06-10 HISTORY — PX: CHOLECYSTECTOMY: SHX55

## 2018-06-10 LAB — HIV ANTIBODY (ROUTINE TESTING W REFLEX): HIV Screen 4th Generation wRfx: NONREACTIVE

## 2018-06-10 LAB — SURGICAL PCR SCREEN
MRSA, PCR: NEGATIVE
Staphylococcus aureus: NEGATIVE

## 2018-06-10 SURGERY — LAPAROSCOPIC CHOLECYSTECTOMY WITH INTRAOPERATIVE CHOLANGIOGRAM
Anesthesia: General | Site: Abdomen

## 2018-06-10 MED ORDER — HYDRALAZINE HCL 20 MG/ML IJ SOLN: 10.0000 mg | Freq: Three times a day (TID) | INTRAMUSCULAR | Status: DC | PRN

## 2018-06-10 MED ORDER — EPHEDRINE SULFATE-NACL 50-0.9 MG/10ML-% IV SOSY
PREFILLED_SYRINGE | INTRAVENOUS | Status: DC | PRN
  Administered 2018-06-10: 5 mg via INTRAVENOUS

## 2018-06-10 MED ORDER — EPHEDRINE 5 MG/ML INJ
INTRAVENOUS | Status: AC
  Filled 2018-06-10: qty 10

## 2018-06-10 MED ORDER — KETOROLAC TROMETHAMINE 30 MG/ML IJ SOLN
INTRAMUSCULAR | Status: AC
  Administered 2018-06-10: 30 mg
  Filled 2018-06-10: qty 1

## 2018-06-10 MED ORDER — CIPROFLOXACIN IN D5W 400 MG/200ML IV SOLN
400.0000 mg | Freq: Once | INTRAVENOUS | Status: AC
  Administered 2018-06-10: 10:00:00 400 mg via INTRAVENOUS

## 2018-06-10 MED ORDER — PROMETHAZINE HCL 25 MG/ML IJ SOLN
12.5000 mg | Freq: Four times a day (QID) | INTRAMUSCULAR | Status: DC | PRN
  Administered 2018-06-10: 12.5 mg via INTRAVENOUS
  Filled 2018-06-10: qty 1

## 2018-06-10 MED ORDER — DEXAMETHASONE SODIUM PHOSPHATE 10 MG/ML IJ SOLN
INTRAMUSCULAR | Status: AC
  Filled 2018-06-10: qty 1

## 2018-06-10 MED ORDER — SUCCINYLCHOLINE CHLORIDE 200 MG/10ML IV SOSY
PREFILLED_SYRINGE | INTRAVENOUS | Status: AC
  Filled 2018-06-10: qty 10

## 2018-06-10 MED ORDER — PHENYLEPHRINE 40 MCG/ML (10ML) SYRINGE FOR IV PUSH (FOR BLOOD PRESSURE SUPPORT)
PREFILLED_SYRINGE | INTRAVENOUS | Status: AC
  Filled 2018-06-10: qty 10

## 2018-06-10 MED ORDER — CIPROFLOXACIN IN D5W 400 MG/200ML IV SOLN: 500.0000 mg | Freq: Once | INTRAVENOUS | Status: DC

## 2018-06-10 MED ORDER — MIDAZOLAM HCL 5 MG/5ML IJ SOLN
INTRAMUSCULAR | Status: DC | PRN
  Administered 2018-06-10: 2 mg via INTRAVENOUS

## 2018-06-10 MED ORDER — ROCURONIUM BROMIDE 10 MG/ML (PF) SYRINGE
PREFILLED_SYRINGE | INTRAVENOUS | Status: DC | PRN
  Administered 2018-06-10: 50 mg via INTRAVENOUS

## 2018-06-10 MED ORDER — CIPROFLOXACIN IN D5W 400 MG/200ML IV SOLN: 400.0000 mg | Freq: Two times a day (BID) | INTRAVENOUS | Status: DC

## 2018-06-10 MED ORDER — PROPOFOL 10 MG/ML IV BOLUS
INTRAVENOUS | Status: DC | PRN
  Administered 2018-06-10: 150 mg via INTRAVENOUS

## 2018-06-10 MED ORDER — LIDOCAINE 2% (20 MG/ML) 5 ML SYRINGE
INTRAMUSCULAR | Status: DC | PRN
  Administered 2018-06-10: 80 mg via INTRAVENOUS

## 2018-06-10 MED ORDER — ACETAMINOPHEN 500 MG PO TABS
1000.0000 mg | ORAL_TABLET | Freq: Three times a day (TID) | ORAL | Status: DC
  Administered 2018-06-10 – 2018-06-12 (×5): 1000 mg via ORAL
  Filled 2018-06-10 (×5): qty 2

## 2018-06-10 MED ORDER — HYDROMORPHONE HCL 1 MG/ML IJ SOLN
INTRAMUSCULAR | Status: AC
  Administered 2018-06-10: 13:00:00 0.25 mg via INTRAVENOUS
  Filled 2018-06-10: qty 1

## 2018-06-10 MED ORDER — ONDANSETRON HCL 4 MG/2ML IJ SOLN
INTRAMUSCULAR | Status: AC
  Filled 2018-06-10: qty 2

## 2018-06-10 MED ORDER — KETOROLAC TROMETHAMINE 30 MG/ML IJ SOLN
30.0000 mg | Freq: Three times a day (TID) | INTRAMUSCULAR | Status: AC
  Administered 2018-06-10: 30 mg via INTRAVENOUS
  Filled 2018-06-10: qty 1

## 2018-06-10 MED ORDER — PHENYLEPHRINE 40 MCG/ML (10ML) SYRINGE FOR IV PUSH (FOR BLOOD PRESSURE SUPPORT)
PREFILLED_SYRINGE | INTRAVENOUS | Status: DC | PRN
  Administered 2018-06-10: 80 ug via INTRAVENOUS
  Administered 2018-06-10: 120 ug via INTRAVENOUS
  Administered 2018-06-10 (×2): 80 ug via INTRAVENOUS

## 2018-06-10 MED ORDER — SUGAMMADEX SODIUM 200 MG/2ML IV SOLN
INTRAVENOUS | Status: DC | PRN
  Administered 2018-06-10: 100 mg via INTRAVENOUS

## 2018-06-10 MED ORDER — SUGAMMADEX SODIUM 200 MG/2ML IV SOLN
INTRAVENOUS | Status: AC
  Filled 2018-06-10: qty 2

## 2018-06-10 MED ORDER — PROPOFOL 10 MG/ML IV BOLUS
INTRAVENOUS | Status: AC
  Filled 2018-06-10: qty 20

## 2018-06-10 MED ORDER — LIDOCAINE 2% (20 MG/ML) 5 ML SYRINGE
INTRAMUSCULAR | Status: AC
  Filled 2018-06-10: qty 5

## 2018-06-10 MED ORDER — BUPIVACAINE-EPINEPHRINE 0.25% -1:200000 IJ SOLN
INTRAMUSCULAR | Status: DC | PRN
  Administered 2018-06-10: 30 mL

## 2018-06-10 MED ORDER — HYDROMORPHONE HCL 1 MG/ML IJ SOLN
0.2500 mg | INTRAMUSCULAR | Status: DC | PRN
  Administered 2018-06-10 (×2): 0.25 mg via INTRAVENOUS
  Administered 2018-06-10: 0.5 mg via INTRAVENOUS

## 2018-06-10 MED ORDER — DEXAMETHASONE SODIUM PHOSPHATE 4 MG/ML IJ SOLN
INTRAMUSCULAR | Status: DC | PRN
  Administered 2018-06-10: 4 mg via INTRAVENOUS

## 2018-06-10 MED ORDER — CIPROFLOXACIN IN D5W 400 MG/200ML IV SOLN
INTRAVENOUS | Status: AC
  Filled 2018-06-10: qty 200

## 2018-06-10 MED ORDER — LACTATED RINGERS IV SOLN
INTRAVENOUS | Status: DC
  Administered 2018-06-10 (×2): via INTRAVENOUS

## 2018-06-10 MED ORDER — CIPROFLOXACIN IN D5W 400 MG/200ML IV SOLN
400.0000 mg | Freq: Two times a day (BID) | INTRAVENOUS | Status: DC
  Administered 2018-06-10 – 2018-06-11 (×2): 400 mg via INTRAVENOUS
  Filled 2018-06-10 (×3): qty 200

## 2018-06-10 MED ORDER — KETOROLAC TROMETHAMINE 30 MG/ML IJ SOLN
30.0000 mg | Freq: Once | INTRAMUSCULAR | Status: AC | PRN
  Administered 2018-06-10: 30 mg via INTRAVENOUS

## 2018-06-10 MED ORDER — FENTANYL CITRATE (PF) 100 MCG/2ML IJ SOLN
INTRAMUSCULAR | Status: DC | PRN
  Administered 2018-06-10 (×2): 50 ug via INTRAVENOUS
  Administered 2018-06-10: 100 ug via INTRAVENOUS
  Administered 2018-06-10: 50 ug via INTRAVENOUS

## 2018-06-10 MED ORDER — BUPIVACAINE-EPINEPHRINE (PF) 0.25% -1:200000 IJ SOLN
INTRAMUSCULAR | Status: AC
  Filled 2018-06-10: qty 30

## 2018-06-10 MED ORDER — MIDAZOLAM HCL 2 MG/2ML IJ SOLN
INTRAMUSCULAR | Status: AC
  Filled 2018-06-10: qty 2

## 2018-06-10 MED ORDER — TRAMADOL HCL 50 MG PO TABS
50.0000 mg | ORAL_TABLET | Freq: Four times a day (QID) | ORAL | Status: DC | PRN
  Administered 2018-06-10 (×2): 50 mg via ORAL
  Filled 2018-06-10 (×2): qty 1

## 2018-06-10 MED ORDER — SUMATRIPTAN SUCCINATE 50 MG PO TABS
100.0000 mg | ORAL_TABLET | ORAL | Status: DC | PRN
  Filled 2018-06-10 (×2): qty 2

## 2018-06-10 MED ORDER — SODIUM CHLORIDE 0.9 % IV SOLN
INTRAVENOUS | Status: DC | PRN
  Administered 2018-06-10: 10:00:00

## 2018-06-10 MED ORDER — FENTANYL CITRATE (PF) 250 MCG/5ML IJ SOLN
INTRAMUSCULAR | Status: AC
  Filled 2018-06-10: qty 5

## 2018-06-10 MED ORDER — PROMETHAZINE HCL 25 MG/ML IJ SOLN: 6.2500 mg | INTRAMUSCULAR | Status: DC | PRN

## 2018-06-10 MED ORDER — LACTATED RINGERS IR SOLN
Status: DC | PRN
  Administered 2018-06-10: 1000 mL

## 2018-06-10 SURGICAL SUPPLY — 46 items
ADH SKN CLS APL DERMABOND .7 (GAUZE/BANDAGES/DRESSINGS) ×2
APL PRP STRL LF DISP 70% ISPRP (MISCELLANEOUS) ×2
APPLIER CLIP 5 13 M/L LIGAMAX5 (MISCELLANEOUS) ×4
APPLIER CLIP ROT 10 11.4 M/L (STAPLE)
APR CLP MED LRG 11.4X10 (STAPLE)
APR CLP MED LRG 5 ANG JAW (MISCELLANEOUS) ×2
BAG SPEC RTRVL 10 TROC 200 (ENDOMECHANICALS) ×2
CABLE HIGH FREQUENCY MONO STRZ (ELECTRODE) ×4 IMPLANT
CHLORAPREP W/TINT 26 (MISCELLANEOUS) ×4 IMPLANT
CHOLANGIOGRAM CATH TAUT (CATHETERS) ×4 IMPLANT
CLIP APPLIE 5 13 M/L LIGAMAX5 (MISCELLANEOUS) IMPLANT
CLIP APPLIE ROT 10 11.4 M/L (STAPLE) IMPLANT
CLOSURE WOUND 1/4X4 (GAUZE/BANDAGES/DRESSINGS)
COVER MAYO STAND STRL (DRAPES) ×4 IMPLANT
COVER SURGICAL LIGHT HANDLE (MISCELLANEOUS) ×4 IMPLANT
COVER WAND RF STERILE (DRAPES) IMPLANT
DECANTER SPIKE VIAL GLASS SM (MISCELLANEOUS) ×4 IMPLANT
DERMABOND ADVANCED (GAUZE/BANDAGES/DRESSINGS) ×2
DERMABOND ADVANCED .7 DNX12 (GAUZE/BANDAGES/DRESSINGS) ×2 IMPLANT
DRAPE C-ARM 42X120 X-RAY (DRAPES) ×4 IMPLANT
ELECT REM PT RETURN 15FT ADLT (MISCELLANEOUS) ×4 IMPLANT
GLOVE SURG SIGNA 7.5 PF LTX (GLOVE) ×4 IMPLANT
GOWN STRL REUS W/TWL XL LVL3 (GOWN DISPOSABLE) ×12 IMPLANT
HEMOSTAT SURGICEL 4X8 (HEMOSTASIS) ×2 IMPLANT
IV CATH 14GX2 1/4 (CATHETERS) ×4 IMPLANT
IV SET EXTENSION CATH 6 NF (IV SETS) ×4 IMPLANT
KIT BASIN OR (CUSTOM PROCEDURE TRAY) ×4 IMPLANT
KIT TURNOVER KIT A (KITS) IMPLANT
POUCH RETRIEVAL ECOSAC 10 (ENDOMECHANICALS) ×2 IMPLANT
POUCH RETRIEVAL ECOSAC 10MM (ENDOMECHANICALS) ×2
SCISSORS LAP 5X35 DISP (ENDOMECHANICALS) ×4 IMPLANT
SET IRRIG TUBING LAPAROSCOPIC (IRRIGATION / IRRIGATOR) ×4 IMPLANT
SET TUBE SMOKE EVAC HIGH FLOW (TUBING) ×4 IMPLANT
SLEEVE ADV FIXATION 5X100MM (TROCAR) ×4 IMPLANT
STOPCOCK 4 WAY LG BORE MALE ST (IV SETS) ×4 IMPLANT
STRIP CLOSURE SKIN 1/4X4 (GAUZE/BANDAGES/DRESSINGS) IMPLANT
SUT MNCRL AB 4-0 PS2 18 (SUTURE) ×4 IMPLANT
SUT NOVA NAB GS-21 0 18 T12 DT (SUTURE) ×2 IMPLANT
SUT VIC AB 2-0 SH 18 (SUTURE) ×2 IMPLANT
SYR 10ML ECCENTRIC (SYRINGE) ×4 IMPLANT
TOWEL OR 17X26 10 PK STRL BLUE (TOWEL DISPOSABLE) ×4 IMPLANT
TOWEL OR NON WOVEN STRL DISP B (DISPOSABLE) ×4 IMPLANT
TRAY LAPAROSCOPIC (CUSTOM PROCEDURE TRAY) ×4 IMPLANT
TROCAR ADV FIXATION 11X100MM (TROCAR) IMPLANT
TROCAR ADV FIXATION 5X100MM (TROCAR) ×4 IMPLANT
TROCAR XCEL BLUNT TIP 100MML (ENDOMECHANICALS) ×4 IMPLANT

## 2018-06-10 NOTE — Anesthesia Preprocedure Evaluation (Signed)
Anesthesia Evaluation  Patient identified by MRN, date of birth, ID band Patient awake    Reviewed: Allergy & Precautions, NPO status , Patient's Chart, lab work & pertinent test results  Airway Mallampati: II  TM Distance: >3 FB Neck ROM: Full    Dental no notable dental hx.    Pulmonary neg pulmonary ROS,    Pulmonary exam normal breath sounds clear to auscultation       Cardiovascular hypertension, Normal cardiovascular exam Rhythm:Regular Rate:Normal     Neuro/Psych  Headaches, negative psych ROS   GI/Hepatic Neg liver ROS, GERD  ,  Endo/Other  negative endocrine ROS  Renal/GU negative Renal ROS  negative genitourinary   Musculoskeletal negative musculoskeletal ROS (+)   Abdominal   Peds negative pediatric ROS (+)  Hematology negative hematology ROS (+)   Anesthesia Other Findings   Reproductive/Obstetrics negative OB ROS                             Anesthesia Physical Anesthesia Plan  ASA: II  Anesthesia Plan: General   Post-op Pain Management:    Induction: Intravenous and Rapid sequence  PONV Risk Score and Plan: 3 and Ondansetron, Dexamethasone and Treatment may vary due to age or medical condition  Airway Management Planned: Oral ETT  Additional Equipment:   Intra-op Plan:   Post-operative Plan: Extubation in OR  Informed Consent: I have reviewed the patients History and Physical, chart, labs and discussed the procedure including the risks, benefits and alternatives for the proposed anesthesia with the patient or authorized representative who has indicated his/her understanding and acceptance.     Dental advisory given  Plan Discussed with: CRNA and Surgeon  Anesthesia Plan Comments:         Anesthesia Quick Evaluation

## 2018-06-10 NOTE — Consult Note (Addendum)
Referring Provider: Dr. Lucia Gaskins Primary Care Physician:  Jonathon Resides, MD Primary Gastroenterologist:  Althia Forts  Reason for Consultation:  Choledocholithiasis  HPI: Claire Goodwin is a 53 y.o. female with acute cholecystitis s/p lap chole today with IOC that was positive for multiple non-obstructing filling defects in the distal common bile duct. Preop TB 1.5, ALP 73, AST 88, ALT 53, Lipase 27. Patient in PACU and sedated from lap chole so no history able to be obtained from the patient.  Past Medical History:  Diagnosis Date  . Anxiety   . Depression   . GERD (gastroesophageal reflux disease)   . History of migraine headaches   . Hypertension   . Strep throat     Past Surgical History:  Procedure Laterality Date  . CESAREAN SECTION    . TONSILLECTOMY      Prior to Admission medications   Medication Sig Start Date End Date Taking? Authorizing Provider  buPROPion (WELLBUTRIN XL) 300 MG 24 hr tablet Take 300 mg by mouth daily.   Yes [provider]  escitalopram (LEXAPRO) 20 MG tablet Take 1 tablet (20 mg total) by mouth daily. Patient taking differently: Take 20 mg by mouth at bedtime.  08/22/13 06/09/18 Yes Zanard, Bernadene Bell, MD  fexofenadine-pseudoephedrine (ALLEGRA-D 24) 180-240 MG 24 hr tablet Take 1 tablet by mouth daily as needed (allergies).   Yes [provider]  fluticasone (FLONASE) 50 MCG/ACT nasal spray Place 2 sprays into both nostrils daily. 08/22/13 06/09/18 Yes Zanard, Bernadene Bell, MD  Levonorgestrel-Ethinyl Estradiol (SEASONIQUE) 0.15-0.03 &0.01 MG tablet Take 1 tablet by mouth daily.   Yes [provider]  promethazine (PHENERGAN) 25 MG tablet Take 1 tablet (25 mg total) by mouth every 8 (eight) hours as needed for nausea or vomiting. 08/22/13  Yes Zanard, Bernadene Bell, MD  rizatriptan (MAXALT) 10 MG tablet Take 10 mg by mouth as needed for migraine. May repeat in 2 hours if needed   Yes [provider]  zonisamide (ZONEGRAN) 100 MG capsule Take  300 mg by mouth daily.   Yes [provider]  nadolol (CORGARD) 20 MG tablet Take 1 tablet (20 mg total) by mouth daily. Patient not taking: Reported on 06/09/2018 08/22/13 08/22/14  Jonathon Resides, MD  omeprazole-sodium bicarbonate (ZEGERID) 40-1100 MG per capsule Take 1 capsule by mouth daily before breakfast. Patient not taking: Reported on 06/09/2018 08/22/13 08/22/14  Jonathon Resides, MD  SUMAtriptan (IMITREX) 100 MG tablet Take 1 tablet (100 mg total) by mouth every 2 (two) hours as needed for migraine. Patient not taking: Reported on 06/09/2018 08/22/13 08/23/14  Jonathon Resides, MD  SUMAtriptan Succinate 6 MG/0.5ML SOTJ Use 1 device in place of a pill as directed for migraine headache. Patient not taking: Reported on 06/09/2018 08/22/13 08/22/14  Zanard, Bernadene Bell, MD  SUMAtriptan-naproxen (TREXIMET) 85-500 MG per tablet Take 1 tablet by mouth every 2 (two) hours as needed for migraine. Patient not taking: Reported on 06/09/2018 08/22/13 08/23/14  Jonathon Resides, MD    Scheduled Meds: . HYDROmorphone      . ketorolac      . [MAR Hold] sodium chloride flush  3 mL Intravenous Once   Continuous Infusions: . dextrose 5 % and 0.45 % NaCl with KCl 20 mEq/L 75 mL/hr at 06/10/18 0017  . lactated ringers 10 mL/hr at 06/10/18 0935   PRN Meds:.[MAR Hold] acetaminophen **OR** [MAR Hold] acetaminophen, HYDROmorphone (DILAUDID) injection, [MAR Hold]  HYDROmorphone (DILAUDID) injection, [MAR Hold] ondansetron **OR** [MAR Hold]  ondansetron (ZOFRAN) IV, [MAR Hold] oxyCODONE, promethazine, [MAR Hold] traMADol  Allergies as of 06/09/2018 - Review Complete 06/09/2018  Allergen Reaction Noted  . Cephalosporins Rash 12/10/2013  . Sulfa antibiotics Rash 09/20/2012    Family History  Problem Relation Age of Onset  . Cancer Mother        Breast Cancer  . Breast cancer Mother   . Cancer Maternal Grandmother        Cervical and Colon Cancer     Social History   Socioeconomic History  . Marital status: Married     Spouse name: Not on file  . Number of children: Not on file  . Years of education: Not on file  . Highest education level: Not on file  Occupational History  . Not on file  Social Needs  . Financial resource strain: Not on file  . Food insecurity:    Worry: Not on file    Inability: Not on file  . Transportation needs:    Medical: Not on file    Non-medical: Not on file  Tobacco Use  . Smoking status: Never Smoker  . Smokeless tobacco: Never Used  Substance and Sexual Activity  . Alcohol use: Yes    Comment: Occasional  . Drug use: No  . Sexual activity: Yes  Lifestyle  . Physical activity:    Days per week: Not on file    Minutes per session: Not on file  . Stress: Not on file  Relationships  . Social connections:    Talks on phone: Not on file    Gets together: Not on file    Attends religious service: Not on file    Active member of club or organization: Not on file    Attends meetings of clubs or organizations: Not on file    Relationship status: Not on file  . Intimate partner violence:    Fear of current or ex partner: Not on file    Emotionally abused: Not on file    Physically abused: Not on file    Forced sexual activity: Not on file  Other Topics Concern  . 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  Physical Exam: Vital signs: Vitals:   06/10/18 1205 06/10/18 1215  BP: (!) 160/97 (!) 159/84  Pulse: 97 98  Resp: 16 18  Temp: (!) 96.7 F (35.9 C)   SpO2: 100% 100%   Last BM Date:  06/09/18 General:   Somnolent, +acute distress, thin Head: normocephalic, atraumatic Eyes: anicteric sclera ENT: oropharynx clear Neck: supple, nontender Lungs:  Clear throughout to auscultation.   No wheezes, crackles, or rhonchi. No acute distress. Heart:  Regular rate and rhythm; no murmurs, clicks, rubs,  or gallops. Abdomen: port tenderness, +distention, decreased bowel sounds  Rectal:  Deferred Ext: no edema  GI:  Lab Results: Recent Labs    06/09/18 1858  WBC 11.3*  HGB 13.7  HCT 42.1  PLT 386   BMET Recent Labs    06/09/18 1858  NA 138  K 3.7  CL 109  CO2 21*  GLUCOSE 90  BUN 13  CREATININE 0.86  CALCIUM 8.5*   LFT Recent Labs    06/09/18 1858  PROT 6.5  ALBUMIN 3.8  AST 88*  ALT 53*  ALKPHOS 73  BILITOT 1.5*   PT/INR No results for input(s): LABPROT, INR in the last 72 hours.   Studies/Results: Dg Cholangiogram Operative  Result Date: 06/10/2018 CLINICAL DATA:  Intraoperative cholangiogram during laparoscopic cholecystectomy. EXAM: INTRAOPERATIVE CHOLANGIOGRAM FLUOROSCOPY TIME:  13 second COMPARISON:  Right upper quadrant abdominal ultrasound-06/09/2018 FINDINGS: Intraoperative cholangiographic images of the right upper abdominal quadrant during laparoscopic cholecystectomy are provided for review. Surgical clips overlie the expected location of the gallbladder fossa. Contrast injection demonstrates selective cannulation of the central aspect of the cystic duct. There is passage of contrast through the central aspect of the cystic duct with filling of a non dilated common bile duct. There is passage of contrast though the CBD and into the descending portion of the duodenum. There is minimal reflux of injected contrast into the common hepatic duct and central aspect of the non dilated intrahepatic biliary system. There are multiple (at least 7) persistent nonocclusive filling defects within the CBD suggestive of nonocclusive choledocholithiasis.  IMPRESSION: Multiple nonocclusive filling defects within the CBD worrisome for nonocclusive choledocholithiasis. Electronically Signed   By: Sandi Mariscal M.D.   On: 06/10/2018 11:34   US Abdomen Limited Ruq  Result Date: 06/09/2018 CLINICAL DATA:  Initial evaluation for acute right upper quadrant abdominal pain. EXAM: ULTRASOUND ABDOMEN LIMITED RIGHT UPPER QUADRANT COMPARISON:  None. FINDINGS: Gallbladder: Multiple small layering echogenic stones present within the gallbladder lumen. Gallbladder wall measure within normal limits at 2.5 mm. No free pericholecystic fluid. No sonographic Murphy sign elicited on exam. Common bile duct: Diameter: 8.5 mm.  No visible choledocholithiasis. Liver: No focal lesion identified. Within normal limits in parenchymal echogenicity. Portal vein is patent on color Doppler imaging with normal direction of blood flow towards the liver. IMPRESSION: 1. Cholelithiasis, with no other sonographic features to suggest acute cholecystitis. 2. Dilatation of the common bile duct up to 8.5 mm. No visible choledocholithiasis by sonography. Electronically Signed   By: Jeannine Boga M.D.   On: 06/09/2018 20:06    Impression/Plan: Choledocholithiasis with dilated ducts with contrast flowing into duodenum on IOC but multiple stones in CBD seen. S/P lap chole today. Needs an ERCP and that is scheduled for tomorrow morning by Dr. Paulita Fujita. Discussed plan and risks/benefits with her husband, Claire Goodwin by phone [phone]) and is agreeable to the procedure being done. When patient wakes up will need to consent patient as well prior to ERCP. Supportive care.    LOS: 0 days   Lear Ng  06/10/2018, 12:22 PM  Questions please call [phone]

## 2018-06-10 NOTE — Anesthesia Procedure Notes (Signed)
Procedure Name: Intubation Date/Time: 06/10/2018 10:09 AM Performed by: Vanessa Hanalei, CRNA Pre-anesthesia Checklist: Patient identified, Emergency Drugs available, Suction available and Patient being monitored Patient Re-evaluated:Patient Re-evaluated prior to induction Oxygen Delivery Method: Circle system utilized Preoxygenation: Pre-oxygenation with 100% oxygen Induction Type: IV induction Laryngoscope Size: 2 and Miller Grade View: Grade I Tube type: Oral Tube size: 6.5 mm Number of attempts: 1 Airway Equipment and Method: Stylet Placement Confirmation: ETT inserted through vocal cords under direct vision,  positive ETCO2 and breath sounds checked- equal and bilateral Secured at: 21 cm Tube secured with: Tape Dental Injury: Teeth and Oropharynx as per pre-operative assessment

## 2018-06-10 NOTE — H&P (View-Only) (Signed)
Referring Provider: Dr. Lucia Gaskins Primary Care Physician:  Jonathon Resides, MD Primary Gastroenterologist:  Althia Forts  Reason for Consultation:  Choledocholithiasis  HPI: Claire Goodwin is a 53 y.o. female with acute cholecystitis s/p lap chole today with IOC that was positive for multiple non-obstructing filling defects in the distal common bile duct. Preop TB 1.5, ALP 73, AST 88, ALT 53, Lipase 27. Patient in PACU and sedated from lap chole so no history able to be obtained from the patient.  Past Medical History:  Diagnosis Date  . Anxiety   . Depression   . GERD (gastroesophageal reflux disease)   . History of migraine headaches   . Hypertension   . Strep throat     Past Surgical History:  Procedure Laterality Date  . CESAREAN SECTION    . TONSILLECTOMY      Prior to Admission medications   Medication Sig Start Date End Date Taking? Authorizing Provider  buPROPion (WELLBUTRIN XL) 300 MG 24 hr tablet Take 300 mg by mouth daily.   Yes [provider]  escitalopram (LEXAPRO) 20 MG tablet Take 1 tablet (20 mg total) by mouth daily. Patient taking differently: Take 20 mg by mouth at bedtime.  08/22/13 06/09/18 Yes Zanard, Bernadene Bell, MD  fexofenadine-pseudoephedrine (ALLEGRA-D 24) 180-240 MG 24 hr tablet Take 1 tablet by mouth daily as needed (allergies).   Yes [provider]  fluticasone (FLONASE) 50 MCG/ACT nasal spray Place 2 sprays into both nostrils daily. 08/22/13 06/09/18 Yes Zanard, Bernadene Bell, MD  Levonorgestrel-Ethinyl Estradiol (SEASONIQUE) 0.15-0.03 &0.01 MG tablet Take 1 tablet by mouth daily.   Yes [provider]  promethazine (PHENERGAN) 25 MG tablet Take 1 tablet (25 mg total) by mouth every 8 (eight) hours as needed for nausea or vomiting. 08/22/13  Yes Zanard, Bernadene Bell, MD  rizatriptan (MAXALT) 10 MG tablet Take 10 mg by mouth as needed for migraine. May repeat in 2 hours if needed   Yes [provider]  zonisamide (ZONEGRAN) 100 MG capsule Take  300 mg by mouth daily.   Yes [provider]  nadolol (CORGARD) 20 MG tablet Take 1 tablet (20 mg total) by mouth daily. Patient not taking: Reported on 06/09/2018 08/22/13 08/22/14  Jonathon Resides, MD  omeprazole-sodium bicarbonate (ZEGERID) 40-1100 MG per capsule Take 1 capsule by mouth daily before breakfast. Patient not taking: Reported on 06/09/2018 08/22/13 08/22/14  Jonathon Resides, MD  SUMAtriptan (IMITREX) 100 MG tablet Take 1 tablet (100 mg total) by mouth every 2 (two) hours as needed for migraine. Patient not taking: Reported on 06/09/2018 08/22/13 08/23/14  Jonathon Resides, MD  SUMAtriptan Succinate 6 MG/0.5ML SOTJ Use 1 device in place of a pill as directed for migraine headache. Patient not taking: Reported on 06/09/2018 08/22/13 08/22/14  Zanard, Bernadene Bell, MD  SUMAtriptan-naproxen (TREXIMET) 85-500 MG per tablet Take 1 tablet by mouth every 2 (two) hours as needed for migraine. Patient not taking: Reported on 06/09/2018 08/22/13 08/23/14  Jonathon Resides, MD    Scheduled Meds: . HYDROmorphone      . ketorolac      . [MAR Hold] sodium chloride flush  3 mL Intravenous Once   Continuous Infusions: . dextrose 5 % and 0.45 % NaCl with KCl 20 mEq/L 75 mL/hr at 06/10/18 0017  . lactated ringers 10 mL/hr at 06/10/18 0935   PRN Meds:.[MAR Hold] acetaminophen **OR** [MAR Hold] acetaminophen, HYDROmorphone (DILAUDID) injection, [MAR Hold]  HYDROmorphone (DILAUDID) injection, [MAR Hold] ondansetron **OR** [MAR Hold]  ondansetron (ZOFRAN) IV, [MAR Hold] oxyCODONE, promethazine, [MAR Hold] traMADol  Allergies as of 06/09/2018 - Review Complete 06/09/2018  Allergen Reaction Noted  . Cephalosporins Rash 12/10/2013  . Sulfa antibiotics Rash 09/20/2012    Family History  Problem Relation Age of Onset  . Cancer Mother        Breast Cancer  . Breast cancer Mother   . Cancer Maternal Grandmother        Cervical and Colon Cancer     Social History   Socioeconomic History  . Marital status: Married     Spouse name: Not on file  . Number of children: Not on file  . Years of education: Not on file  . Highest education level: Not on file  Occupational History  . Not on file  Social Needs  . Financial resource strain: Not on file  . Food insecurity:    Worry: Not on file    Inability: Not on file  . Transportation needs:    Medical: Not on file    Non-medical: Not on file  Tobacco Use  . Smoking status: Never Smoker  . Smokeless tobacco: Never Used  Substance and Sexual Activity  . Alcohol use: Yes    Comment: Occasional  . Drug use: No  . Sexual activity: Yes  Lifestyle  . Physical activity:    Days per week: Not on file    Minutes per session: Not on file  . Stress: Not on file  Relationships  . Social connections:    Talks on phone: Not on file    Gets together: Not on file    Attends religious service: Not on file    Active member of club or organization: Not on file    Attends meetings of clubs or organizations: Not on file    Relationship status: Not on file  . Intimate partner violence:    Fear of current or ex partner: Not on file    Emotionally abused: Not on file    Physically abused: Not on file    Forced sexual activity: Not on file  Other Topics Concern  . 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  Physical Exam: Vital signs: Vitals:   06/10/18 1205 06/10/18 1215  BP: (!) 160/97 (!) 159/84  Pulse: 97 98  Resp: 16 18  Temp: (!) 96.7 F (35.9 C)   SpO2: 100% 100%   Last BM Date:  06/09/18 General:   Somnolent, +acute distress, thin Head: normocephalic, atraumatic Eyes: anicteric sclera ENT: oropharynx clear Neck: supple, nontender Lungs:  Clear throughout to auscultation.   No wheezes, crackles, or rhonchi. No acute distress. Heart:  Regular rate and rhythm; no murmurs, clicks, rubs,  or gallops. Abdomen: port tenderness, +distention, decreased bowel sounds  Rectal:  Deferred Ext: no edema  GI:  Lab Results: Recent Labs    06/09/18 1858  WBC 11.3*  HGB 13.7  HCT 42.1  PLT 386   BMET Recent Labs    06/09/18 1858  NA 138  K 3.7  CL 109  CO2 21*  GLUCOSE 90  BUN 13  CREATININE 0.86  CALCIUM 8.5*   LFT Recent Labs    06/09/18 1858  PROT 6.5  ALBUMIN 3.8  AST 88*  ALT 53*  ALKPHOS 73  BILITOT 1.5*   PT/INR No results for input(s): LABPROT, INR in the last 72 hours.   Studies/Results: Dg Cholangiogram Operative  Result Date: 06/10/2018 CLINICAL DATA:  Intraoperative cholangiogram during laparoscopic cholecystectomy. EXAM: INTRAOPERATIVE CHOLANGIOGRAM FLUOROSCOPY TIME:  13 second COMPARISON:  Right upper quadrant abdominal ultrasound-06/09/2018 FINDINGS: Intraoperative cholangiographic images of the right upper abdominal quadrant during laparoscopic cholecystectomy are provided for review. Surgical clips overlie the expected location of the gallbladder fossa. Contrast injection demonstrates selective cannulation of the central aspect of the cystic duct. There is passage of contrast through the central aspect of the cystic duct with filling of a non dilated common bile duct. There is passage of contrast though the CBD and into the descending portion of the duodenum. There is minimal reflux of injected contrast into the common hepatic duct and central aspect of the non dilated intrahepatic biliary system. There are multiple (at least 7) persistent nonocclusive filling defects within the CBD suggestive of nonocclusive choledocholithiasis.  IMPRESSION: Multiple nonocclusive filling defects within the CBD worrisome for nonocclusive choledocholithiasis. Electronically Signed   By: Sandi Mariscal M.D.   On: 06/10/2018 11:34   US Abdomen Limited Ruq  Result Date: 06/09/2018 CLINICAL DATA:  Initial evaluation for acute right upper quadrant abdominal pain. EXAM: ULTRASOUND ABDOMEN LIMITED RIGHT UPPER QUADRANT COMPARISON:  None. FINDINGS: Gallbladder: Multiple small layering echogenic stones present within the gallbladder lumen. Gallbladder wall measure within normal limits at 2.5 mm. No free pericholecystic fluid. No sonographic Murphy sign elicited on exam. Common bile duct: Diameter: 8.5 mm.  No visible choledocholithiasis. Liver: No focal lesion identified. Within normal limits in parenchymal echogenicity. Portal vein is patent on color Doppler imaging with normal direction of blood flow towards the liver. IMPRESSION: 1. Cholelithiasis, with no other sonographic features to suggest acute cholecystitis. 2. Dilatation of the common bile duct up to 8.5 mm. No visible choledocholithiasis by sonography. Electronically Signed   By: Jeannine Boga M.D.   On: 06/09/2018 20:06    Impression/Plan: Choledocholithiasis with dilated ducts with contrast flowing into duodenum on IOC but multiple stones in CBD seen. S/P lap chole today. Needs an ERCP and that is scheduled for tomorrow morning by Dr. Paulita Fujita. Discussed plan and risks/benefits with her husband, Almadelia Looman by phone [phone]) and is agreeable to the procedure being done. When patient wakes up will need to consent patient as well prior to ERCP. Supportive care.    LOS: 0 days   Lear Ng  06/10/2018, 12:22 PM  Questions please call [phone]

## 2018-06-10 NOTE — Discharge Instructions (Signed)
[facility] ______[facility] °LAPAROSCOPIC SURGERY: POST OP INSTRUCTIONS °Always review your discharge instruction sheet given to you by the facility where your surgery was performed. °IF YOU HAVE DISABILITY OR FAMILY LEAVE FORMS, YOU MUST BRING THEM TO THE OFFICE FOR PROCESSING.   °DO NOT GIVE THEM TO YOUR DOCTOR. ° °1. A prescription for pain medication may be given to you upon discharge.  Take your pain medication as prescribed, if needed.  If narcotic pain medicine is not needed, then you may take acetaminophen (Tylenol) or ibuprofen (Advil) as needed. °2. Take your usually prescribed medications unless otherwise directed. °3. If you need a refill on your pain medication, please contact your pharmacy.  They will contact our office to request authorization. Prescriptions will not be filled after 5pm or on week-ends. °4. You should follow a light diet the first few days after arrival home, such as soup and crackers, etc.  Be sure to include lots of fluids daily. °5. Most patients will experience some swelling and bruising in the area of the incisions.  Ice packs will help.  Swelling and bruising can take several days to resolve.  °6. It is common to experience some constipation if taking pain medication after surgery.  Increasing fluid intake and taking a stool softener (such as Colace) will usually help or prevent this problem from occurring.  A mild laxative (Milk of Magnesia or Miralax) should be taken according to package instructions if there are no bowel movements after 48 hours. °7. Unless discharge instructions indicate otherwise, you may remove your bandages 24-48 hours after surgery, and you may shower at that time.  You may have steri-strips (small skin tapes) in place directly over the incision.  These strips should be left on the skin for 7-10 days.  If your surgeon used skin glue on the incision, you may shower in 24 hours.  The glue will flake off over the next 2-3 weeks.  Any sutures or  staples will be removed at the office during your follow-up visit. °8. ACTIVITIES:  You may resume regular (light) daily activities beginning the next day--such as daily self-care, walking, climbing stairs--gradually increasing activities as tolerated.  You may have sexual intercourse when it is comfortable.  Refrain from any heavy lifting or straining until approved by your doctor. °a. You may drive when you are no longer taking prescription pain medication, you can comfortably wear a seatbelt, and you can safely maneuver your car and apply brakes. °b. RETURN TO WORK:  __________________________________________________________ °9. You should see your doctor in the office for a follow-up appointment approximately 2-3 weeks after your surgery.  Make sure that you call for this appointment within a day or two after you arrive home to insure a convenient appointment time. °10. OTHER INSTRUCTIONS: __________________________________________________________________________________________________________________________ __________________________________________________________________________________________________________________________ °WHEN TO CALL YOUR DOCTOR: °1. Fever over 101.0 °2. Inability to urinate °3. Continued bleeding from incision. °4. Increased pain, redness, or drainage from the incision. °5. Increasing abdominal pain ° °The clinic staff is available to answer your questions during regular business hours.  Please don’t hesitate to call and ask to speak to one of the nurses for clinical concerns.  If you have a medical emergency, go to the nearest emergency room or call 911.  A surgeon from [facility] is always on call at the hospital. °[address], [address], [place], [place]  [address] ? [address] [address], [place],    [address] °[phone] ? [phone] ? FAX [phone] °Web site:   [email or web address]  [facility] _______[facility]  UMBILICAL OR  INGUINAL HERNIA REPAIR: POST OP INSTRUCTIONS No lifting over 15 pounds for 6 weeks.   Always review your discharge instruction sheet given to you by the facility where your surgery was performed. IF YOU HAVE DISABILITY OR FAMILY LEAVE FORMS, YOU MUST BRING THEM TO THE OFFICE FOR PROCESSING.   DO NOT GIVE THEM TO YOUR DOCTOR.  1. A  prescription for pain medication may be given to you upon discharge.  Take your pain medication as prescribed, if needed.  If narcotic pain medicine is not needed, then you may take acetaminophen (Tylenol) or ibuprofen (Advil) as needed. 2. Take your usually prescribed medications unless otherwise directed. If you need a refill on your pain medication, please contact your pharmacy.  They will contact our office to request authorization. Prescriptions will not be filled after 5 pm or on week-ends. 3. You should follow a light diet the first 24 hours after arrival home, such as soup and crackers, etc.  Be sure to include lots of fluids daily.  Resume your normal diet the day after surgery. 4.Most patients will experience some swelling and bruising around the umbilicus or in the groin and scrotum.  Ice packs and reclining will help.  Swelling and bruising can take several days to resolve.  6. It is common to experience some constipation if taking pain medication after surgery.  Increasing fluid intake and taking a stool softener (such as Colace) will usually help or prevent this problem from occurring.  A mild laxative (Milk of Magnesia or Miralax) should be taken according to package directions if there are no bowel movements after 48 hours. 7. Unless discharge instructions indicate otherwise, you may remove your bandages 24-48 hours after surgery, and you may shower at that time.  You may have steri-strips (small skin tapes) in place directly over the incision.  These strips should be left on the skin for 7-10 days.  If your surgeon used skin glue on the incision, you may  shower in 24 hours.  The glue will flake off over the next 2-3 weeks.  Any sutures or staples will be removed at the office during your follow-up visit. 8. ACTIVITIES:  You may resume regular (light) daily activities beginning the next day--such as daily self-care, walking, climbing stairs--gradually increasing activities as tolerated.  You may have sexual intercourse when it is comfortable.  Refrain from any heavy lifting or straining until approved by your doctor.  a.You may drive when you are no longer taking prescription pain medication, you can comfortably wear a seatbelt, and you can safely maneuver your car and apply brakes. b.RETURN TO WORK:   _____________________________________________  9.You should see your doctor in the office for a follow-up appointment approximately 2-3 weeks after your surgery.  Make sure that you call for this appointment within a day or two after you arrive home to insure a convenient appointment time. 10.OTHER INSTRUCTIONS: _________________________    _____________________________________  WHEN TO CALL YOUR DOCTOR: 6. Fever over 101.0 7. Inability to urinate 8. Nausea and/or vomiting 9. Extreme swelling or bruising 10. Continued bleeding from incision. 11. Increased pain, redness, or drainage from the incision  The clinic staff is available to answer your questions during regular business hours.  Please dont hesitate to call and ask to speak to one of the nurses for clinical concerns.  If you have a medical emergency, go to the nearest emergency room or call 911.  A surgeon from  [facility] is always on call at the hospital   [address], [place], [place], [place]  [address] ?  [address], [place], [place]   [address] [phone] ? [phone] ? FAX [phone] Web site: [email or web address]

## 2018-06-10 NOTE — Anesthesia Postprocedure Evaluation (Signed)
Anesthesia Post Note  Patient: Claire Goodwin  Procedure(s) Performed: LAPAROSCOPIC CHOLECYSTECTOMY WITH INTRAOPERATIVE CHOLANGIOGRAM (N/A Abdomen) HERNIA REPAIR UMBILICAL ADULT (Abdomen)     Patient location during evaluation: PACU Anesthesia Type: General Level of consciousness: awake and alert Pain management: pain level controlled Vital Signs Assessment: post-procedure vital signs reviewed and stable Respiratory status: spontaneous breathing, nonlabored ventilation, respiratory function stable and patient connected to nasal cannula oxygen Cardiovascular status: blood pressure returned to baseline and stable Postop Assessment: no apparent nausea or vomiting Anesthetic complications: no    Last Vitals:  Vitals:   06/10/18 1230 06/10/18 1245  BP: (!) 151/99 (!) 147/88  Pulse: 96 (!) 102  Resp: 16 (!) 23  Temp:    SpO2: 99% 100%    Last Pain:  Vitals:   06/10/18 1230  TempSrc:   PainSc: Asleep                 Estalene Bergey S

## 2018-06-10 NOTE — Op Note (Addendum)
06/09/2018 - 06/10/2018  11:53 AM  PATIENT:  Claire Goodwin, 53 y.o., female, MRN: [id number]  PREOP DIAGNOSIS:  Cholecystitis, cholelithiasis, umbilical hernia  POSTOP DIAGNOSIS:   Mildly edematous cholecystitis, cholelithiasis, choledocholithiasis, incarcerated umbilical hernia (with omentum incarcerated in the hernia)  PROCEDURE:   Procedure(s): LAPAROSCOPIC CHOLECYSTECTOMY WITH INTRAOPERATIVE CHOLANGIOGRAM, open HERNIA REPAIR UMBILICAL ADULT, resection of incarcerated omentum in the hernia.  SURGEON:   Ovidio Kinavid Manal Kreutzer, M.D.  ASSISTANT:   Clyde CanterburyMichael Mazcis, PA  ANESTHESIA:   general  Anesthesiologist: Eilene Ghaziose, George, MD CRNA: Vanessa Durhamochran, Ronald Glenn, CRNA; Nelle DonPulliam, Elizabeth C, CRNA  General  ASA: 2E  EBL:  Minimal  ml  BLOOD ADMINISTERED: none  DRAINS: none   LOCAL MEDICATIONS USED:   30 cc of 1/4% marcaine  SPECIMEN:   Gall bladder  COUNTS CORRECT:  YES  INDICATIONS FOR PROCEDURE:  Claire Goodwin is a 53 y.o. (DOB: September 02, 1965) white female whose primary care physician is Zanard, Hinton Dyerobyn K, MD and comes for cholecystectomy and possible umbilical hernia repair.   Her primary symptom is the gall bladder disease, but she has an incarcerated umbilical hernia.   The indications and risks of the gall bladder surgery were explained to the patient.  The risks include, but are not limited to, infection, bleeding, common bile duct injury and open surgery.  SURGERY:  The patient was taken to OR room #2 at [facility].  The abdomen was prepped with chloroprep.  The patient was given 2 gm Ancef at the beginning of the operation.   A time out was held and the surgical checklist run.   I made a midline infraumbilical incision to take care of an incarcerated umbilical hernia.  I isolated the hernia sac and opened the sac.  She had incarcerated omentum in the sac.  I amputated the omentum with 2-0 vicryl sutures and reduced the omentum back into the abdominal cavity.  I then used the  umbilical hernia defect to place my 12 mm Hasson trocar. The Hasson trocar was secured with a 0 Vicryl suture.  Three additional trocars were inserted: a 5 mm trocar in the sub-xiphoid location, a 5 mm trocar in the right mid subcostal area, and a 5 mm trocar in the right lateral subcostal area.   The abdomen was explored and the liver, stomach, and bowel that could be seen were unremarkable.   The gall bladder was mildly edematous and white.   I grasped the gall bladder and rotated it cephalad.  Disssection was carried down to the gall bladder/cystic duct junction and the cystic duct isolated. The cystic duct was large.  A clip was placed on the gall bladder side of the cystic duct.   An intra-operative cholangiogram was shot.   The intra-operative cholangiogram was shot using a cut off Taut catheter placed through a 14 gauge angiocath in the RUQ.  The Taut catheter was inserted in the cut cystic duct and secured with an endoclip.  A cholangiogram was shot with 8 cc of 1/2 strength Isoview.  Using fluoroscopy, the cholangiogram showed the flow of contrast into the common bile duct, up the hepatic radicals, and into the duodenum.  She had multiple filling defects in the common bile, consistent with common bile duct stones.   The Taut catheter was removed.  The cystic duct was tripley endoclipped and the cystic artery was identified and clipped.  The gall bladder was bluntly and sharpley dissected from the gall bladder bed.   After the gall bladder was removed  from the liver, the gall bladder bed and Triangle of Calot were inspected.  There was no bleeding or bile leak.  I did put some Surgicel in the gall bladder fossa.  The gall bladder was placed in a Ecco Sac bag and delivered through the umbilicus.  The abdomen was irrigated with 1,000 cc saline.   The trocars were then removed.  I infiltrated 30 cc of 1/4% Marcaine into the incisions.  The umbilical fascial defect was closed with interrupted 0  Novafil (times three) and closed with a 0 Vicryl suture and the skin closed with 4-0 Monocryl.  The skin was painted with DermaBond.  The patient's sponge and needle count were correct.  The patient was transported to the RR in good condition.   I have spoken to Dr. Bosie Clos about Ms. Claire Goodwin.  Dr. Dulce Sellar was aware of the patient and will follow up regarding the common duct stones.  Ovidio Kin, MD, [facility] Pager: [phone] Office phone:  [phone]

## 2018-06-10 NOTE — Discharge Summary (Addendum)
Physician Discharge Summary  Patient ID: Claire RubensteinLisa A Goodwin MRN: [id number] DOB/AGE: 1965-09-14 53 y.o.  Admit date: 06/09/2018 Discharge date: 06/12/2018  Admission Diagnoses:  Cholecystitis/cholelithiasis Umbilical hernia Hx of migraine headache Anxiety  Discharge Diagnoses:  Cholecystitis/cholelithiasis/choledocholithiasis Umbilical hernia Hx of migraine headache Anxiety   Principal Problem:   Cholelithiasis with chronic cholecystitis Active Problems:   Biliary colic   Elevated liver enzymes   PROCEDURES: 1.  Laparoscopic cholecystectomy with IOC, 5/22//20 2.  ENDOSCOPIC RETROGRADE CHOLANGIOPANCREATOGRAPHY (ERCP),  SPHINCTEROTOMY, REMOVAL OF STONES, 06/11/18 Dr. Smith RobertWilliam Outlaw   Hospital Course:  Patient is a 53 year old female who presents to the emergency department with unrelenting upper abdominal pain.  Patient has experienced similar symptoms intermittently since the fall 2019.  Episodes present with pain above the level of the umbilicus and radiating into the right upper quadrant of the abdomen and into the back.  They are associated with nausea.  Patient denies any emesis.  Episodes last for approximately 2 hours and then resolved.  Today the patient developed similar type abdominal pain but it became more severe.  It has lasted longer than her normal attack.  She presented to the emergency department for evaluation.  Ultrasound demonstrates multiple small gallstones.  There is no sign of infection.  There is mild dilatation of the common bile duct at 8.5 mm.  Laboratory studies show a minimally elevated white blood cell count.  Transaminases are slightly elevated.  Total bilirubin is 1.5.  Alkaline phosphatase is normal.  Patient has had prior cesarean section.  She has had no other abdominal surgery.  There is no family history of gallbladder disease.  Patient denies any history of hepatobiliary or pancreatic disease.  She denies jaundice or acholic stools.  Patient does have  irregular bowel movements with frequent diarrhea and constipation.  She was admitted and seen by Dr. Ezzard StandingNewman on 06/10/18 in the AM and taken to the OR later that AM.  She underwent cholecystectomy with IOC showed multiple filling defects consistent with stones in the bile duct.  She was seen by Dr. [facility].  He agreed with diagnosis.  Pt underwent EGD 06/11/18 by Dr. Willis ModenaWilliam Outlaw.   Post op she did well and was discharged home the following AM by Dr. Carolynne Edouardoth.  CBC Latest Ref Rng & Units 06/11/2018 06/09/2018 03/13/2013  WBC 4.0 - 10.5 K/uL 15.6(H) 11.3(H) 9.3  Hemoglobin 12.0 - 15.0 g/dL 40.913.1 81.113.7 91.413.5  Hematocrit 36.0 - 46.0 % 41.1 42.1 39.4  Platelets 150 - 400 K/uL 353 386 368   CMP Latest Ref Rng & Units 06/11/2018 06/09/2018 03/13/2013  Glucose 70 - 99 mg/dL 782(N108(H) 90 82  BUN 6 - 20 mg/dL 6 13 12   Creatinine 0.44 - 1.00 mg/dL 5.620.74 1.300.86 8.650.67  Sodium 135 - 145 mmol/L 138 138 138  Potassium 3.5 - 5.1 mmol/L 3.6 3.7 4.4  Chloride 98 - 111 mmol/L 111 109 107  CO2 22 - 32 mmol/L 20(L) 21(L) 22  Calcium 8.9 - 10.3 mg/dL 8.3(L) 8.5(L) 8.8  Total Protein 6.5 - 8.1 g/dL 6.4(L) 6.5 6.1  Total Bilirubin 0.3 - 1.2 mg/dL 1.0 7.8(I1.5(H) 0.4  Alkaline Phos 38 - 126 U/L 122 73 38(L)  AST 15 - 41 U/L 132(H) 88(H) 21  ALT 0 - 44 U/L 165(H) 53(H) 31    Condition on DC:  Improved  Disposition: Home  Discharge Instructions    Call MD for:  difficulty breathing, headache or visual disturbances   Complete by:  As directed  Call MD for:  extreme fatigue   Complete by:  As directed    Call MD for:  hives   Complete by:  As directed    Call MD for:  persistant dizziness or light-headedness   Complete by:  As directed    Call MD for:  persistant nausea and vomiting   Complete by:  As directed    Call MD for:  redness, tenderness, or signs of infection (pain, swelling, redness, odor or green/yellow discharge around incision site)   Complete by:  As directed    Call MD for:  severe  uncontrolled pain   Complete by:  As directed    Call MD for:  temperature >100.4   Complete by:  As directed    Diet - low sodium heart healthy   Complete by:  As directed    Discharge instructions   Complete by:  As directed    May shower. Low fat diet. No heavy lifting   Increase activity slowly   Complete by:  As directed    No wound care   Complete by:  As directed      Allergies as of 06/12/2018      Reactions   Cephalosporins Rash   Sulfa Antibiotics Rash      Medication List    TAKE these medications   buPROPion 300 MG 24 hr tablet Commonly known as:  WELLBUTRIN XL Take 300 mg by mouth daily.   escitalopram 20 MG tablet Commonly known as:  LEXAPRO Take 1 tablet (20 mg total) by mouth daily. What changed:  when to take this   fexofenadine-pseudoephedrine 180-240 MG 24 hr tablet Commonly known as:  ALLEGRA-D 24 Take 1 tablet by mouth daily as needed (allergies).   fluticasone 50 MCG/ACT nasal spray Commonly known as:  Flonase Place 2 sprays into both nostrils daily.   nadolol 20 MG tablet Commonly known as:  CORGARD Take 1 tablet (20 mg total) by mouth daily.   omeprazole-sodium bicarbonate 40-1100 MG capsule Commonly known as:  ZEGERID Take 1 capsule by mouth daily before breakfast.   oxyCODONE 5 MG immediate release tablet Commonly known as:  Oxy IR/ROXICODONE Take 1-2 tablets (5-10 mg total) by mouth every 6 (six) hours as needed for moderate pain.   promethazine 25 MG tablet Commonly known as:  PHENERGAN Take 1 tablet (25 mg total) by mouth every 8 (eight) hours as needed for nausea or vomiting.   rizatriptan 10 MG tablet Commonly known as:  MAXALT Take 10 mg by mouth as needed for migraine. May repeat in 2 hours if needed   Seasonique 0.15-0.03 &0.01 MG tablet Generic drug:  Levonorgestrel-Ethinyl Estradiol Take 1 tablet by mouth daily.   SUMAtriptan Succinate 6 MG/0.5ML Sotj Use 1 device in place of a pill as directed for migraine  headache.   SUMAtriptan 100 MG tablet Commonly known as:  IMITREX Take 1 tablet (100 mg total) by mouth every 2 (two) hours as needed for migraine.   SUMAtriptan-naproxen 85-500 MG tablet Commonly known as:  Treximet Take 1 tablet by mouth every 2 (two) hours as needed for migraine.   zonisamide 100 MG capsule Commonly known as:  ZONEGRAN Take 300 mg by mouth daily.      Follow-up Information    [facility], [facility] Follow up on 06/23/2018.   Specialty:  General Surgery Why:  Your follow up will be a phone call appointment  (due to the [facility] virus, to limit exposure.) Hedda Slade will call you  at 2:00 PM.  Please email a picture if you have an concerns with you wound to : [email or web address] @ [email or web address]  Contact information: [address] [place] [address] [phone]        Gillian Scarce, MD Follow up.   Specialty:  Family Medicine Why:  call and let them know you had surgery and follow up for medical issues Contact information: [address] [place] [address] [phone]           Signed: Sherrie George 06/15/2018, 2:49 PM  Agree with above.  Ovidio Kin, MD, [facility] Pager: [phone] Office phone:  [phone]

## 2018-06-10 NOTE — Transfer of Care (Signed)
Immediate Anesthesia Transfer of Care Note  Patient: Claire Goodwin  Procedure(s) Performed: LAPAROSCOPIC CHOLECYSTECTOMY WITH INTRAOPERATIVE CHOLANGIOGRAM (N/A Abdomen) HERNIA REPAIR UMBILICAL ADULT (Abdomen)  Patient Location: PACU  Anesthesia Type:General  Level of Consciousness: awake and patient cooperative  Airway & Oxygen Therapy: Patient Spontanous Breathing and Patient connected to face mask  Post-op Assessment: Report given to RN and Post -op Vital signs reviewed and stable  Post vital signs: Reviewed and stable  Last Vitals:  Vitals Value Taken Time  BP 160/97 06/10/2018 12:04 PM  Temp    Pulse 96 06/10/2018 12:05 PM  Resp 19 06/10/2018 12:05 PM  SpO2 100 % 06/10/2018 12:05 PM  Vitals shown include unvalidated device data.  Last Pain:  Vitals:   06/10/18 0929  TempSrc:   PainSc: 2       Patients Stated Pain Goal: 1 (06/10/18 0929)  Complications: No apparent anesthesia complications

## 2018-06-10 NOTE — Progress Notes (Signed)
[facility] Office:  [phone] General Surgery Progress Note   LOS: 0 days  POD -  Day of Surgery  Chief Complaint: Abdominal pain  Coronavirus days.  Assessment and Plan: 1.  Cholecystitis, cholelithiasis  For LAPAROSCOPIC CHOLECYSTECTOMY WITH INTRAOPERATIVE CHOLANGIOGRAM later this AM   I discussed with the patient the indications and risks of gall bladder surgery.  The primary risks of gall bladder surgery include, but are not limited to, bleeding, infection, common bile duct injury, and open surgery.  There is also the risk that the patient may have continued symptoms after surgery.  We discussed the typical post-operative recovery course. I tried to answer the patient's questions.  I did talk about her mildly enlarged CBD and mildly elevated LFT's and their implication.  I gave the patient a print out about gall bladder surgery.  I talked to her husband, Nedra HaiLee, on the phone, about the surgery.  2.  Umbilical hernia   I may repair this at the same time, depending on her gall bladder surgery. 3.  History of migraine headaches 4.  DVT prophylaxis - on hold for surgery   Principal Problem:   Cholelithiasis with chronic cholecystitis Active Problems:   Biliary colic   Elevated liver enzymes  Subjective:  Still a little sore, but feels better.  Very nervous personality.  I spoke to husband on the phone.  According to the wife, he has had a subtotal colectomy for ulcerative colitis and hip replacement.  Objective:   Vitals:   06/10/18 0144 06/10/18 0420  BP: (!) 151/75 134/82  Pulse: 92 85  Resp: 16 16  Temp: 97.6 F (36.4 C) 98.1 F (36.7 C)  SpO2: 99% 100%     Intake/Output from previous day:  05/21 0701 - 05/22 0700 In: 1220 [P.O.:120; I.V.:100; IV Piggyback:1000] Out: -   Intake/Output this shift:  No intake/output data recorded.   Physical Exam:   General: Thin WF who is alert and oriented.    HEENT: Normal. Pupils equal. .   Lungs:  Clear   Abdomen: Soft, mild tenderness in the RUQ.   Lab Results:    Recent Labs    06/09/18 1858  WBC 11.3*  HGB 13.7  HCT 42.1  PLT 386    BMET   Recent Labs    06/09/18 1858  NA 138  K 3.7  CL 109  CO2 21*  GLUCOSE 90  BUN 13  CREATININE 0.86  CALCIUM 8.5*    PT/INR  No results for input(s): LABPROT, INR in the last 72 hours.  ABG  No results for input(s): PHART, HCO3 in the last 72 hours.  Invalid input(s): PCO2, PO2   Studies/Results:  [place] Abdomen Limited Ruq  Result Date: 06/09/2018 CLINICAL DATA:  Initial evaluation for acute right upper quadrant abdominal pain. EXAM: ULTRASOUND ABDOMEN LIMITED RIGHT UPPER QUADRANT COMPARISON:  None. FINDINGS: Gallbladder: Multiple small layering echogenic stones present within the gallbladder lumen. Gallbladder wall measure within normal limits at 2.5 mm. No free pericholecystic fluid. No sonographic Murphy sign elicited on exam. Common bile duct: Diameter: 8.5 mm.  No visible choledocholithiasis. Liver: No focal lesion identified. Within normal limits in parenchymal echogenicity. Portal vein is patent on color Doppler imaging with normal direction of blood flow towards the liver. IMPRESSION: 1. Cholelithiasis, with no other sonographic features to suggest acute cholecystitis. 2. Dilatation of the common bile duct up to 8.5 mm. No visible choledocholithiasis by sonography. Electronically Signed   By: Janell QuietBenjamin  McClintock M.D.  On: 06/09/2018 20:06     Anti-infectives:   Anti-infectives (From admission, onward)   None      Ovidio Kin, MD, FACS Pager: [phone] [facility] Surgery Office: [phone] 06/10/2018

## 2018-06-11 ENCOUNTER — Observation Stay (HOSPITAL_COMMUNITY): Payer: PRIVATE HEALTH INSURANCE | Admitting: Registered Nurse

## 2018-06-11 ENCOUNTER — Encounter (HOSPITAL_COMMUNITY): Admission: EM | Disposition: A | Discharge status: Home / Self Care | Payer: Self-pay | Source: Home / Self Care

## 2018-06-11 ENCOUNTER — Encounter (HOSPITAL_COMMUNITY): Payer: Self-pay | Admitting: Surgery

## 2018-06-11 ENCOUNTER — Observation Stay (HOSPITAL_COMMUNITY): Payer: PRIVATE HEALTH INSURANCE

## 2018-06-11 DIAGNOSIS — Z7951 Long term (current) use of inhaled steroids: Secondary | ICD-10-CM | POA: Diagnosis not present

## 2018-06-11 DIAGNOSIS — R748 Abnormal levels of other serum enzymes: Secondary | ICD-10-CM | POA: Diagnosis present

## 2018-06-11 DIAGNOSIS — Z882 Allergy status to sulfonamides status: Secondary | ICD-10-CM | POA: Diagnosis not present

## 2018-06-11 DIAGNOSIS — F329 Major depressive disorder, single episode, unspecified: Secondary | ICD-10-CM | POA: Diagnosis present

## 2018-06-11 DIAGNOSIS — K5900 Constipation, unspecified: Secondary | ICD-10-CM | POA: Diagnosis present

## 2018-06-11 DIAGNOSIS — R1011 Right upper quadrant pain: Secondary | ICD-10-CM | POA: Diagnosis present

## 2018-06-11 DIAGNOSIS — F419 Anxiety disorder, unspecified: Secondary | ICD-10-CM | POA: Diagnosis present

## 2018-06-11 DIAGNOSIS — Z1159 Encounter for screening for other viral diseases: Secondary | ICD-10-CM | POA: Diagnosis not present

## 2018-06-11 DIAGNOSIS — K8066 Calculus of gallbladder and bile duct with acute and chronic cholecystitis without obstruction: Secondary | ICD-10-CM | POA: Diagnosis present

## 2018-06-11 DIAGNOSIS — Z79899 Other long term (current) drug therapy: Secondary | ICD-10-CM | POA: Diagnosis not present

## 2018-06-11 DIAGNOSIS — I10 Essential (primary) hypertension: Secondary | ICD-10-CM | POA: Diagnosis present

## 2018-06-11 DIAGNOSIS — K420 Umbilical hernia with obstruction, without gangrene: Secondary | ICD-10-CM | POA: Diagnosis present

## 2018-06-11 DIAGNOSIS — J302 Other seasonal allergic rhinitis: Secondary | ICD-10-CM | POA: Diagnosis present

## 2018-06-11 DIAGNOSIS — K219 Gastro-esophageal reflux disease without esophagitis: Secondary | ICD-10-CM | POA: Diagnosis present

## 2018-06-11 DIAGNOSIS — Z881 Allergy status to other antibiotic agents status: Secondary | ICD-10-CM | POA: Diagnosis not present

## 2018-06-11 DIAGNOSIS — Z98891 History of uterine scar from previous surgery: Secondary | ICD-10-CM | POA: Diagnosis not present

## 2018-06-11 DIAGNOSIS — E785 Hyperlipidemia, unspecified: Secondary | ICD-10-CM | POA: Diagnosis present

## 2018-06-11 HISTORY — PX: SPHINCTEROTOMY: SHX5544

## 2018-06-11 HISTORY — PX: REMOVAL OF STONES: SHX5545

## 2018-06-11 HISTORY — PX: ERCP: SHX5425

## 2018-06-11 LAB — CBC
HCT: 41.1 % (ref 36.0–46.0)
Hemoglobin: 13.1 g/dL (ref 12.0–15.0)
MCH: 30.5 pg (ref 26.0–34.0)
MCHC: 31.9 g/dL (ref 30.0–36.0)
MCV: 95.8 fL (ref 80.0–100.0)
Platelets: 353 10*3/uL (ref 150–400)
RBC: 4.29 MIL/uL (ref 3.87–5.11)
RDW: 13 % (ref 11.5–15.5)
WBC: 15.6 10*3/uL — ABNORMAL HIGH (ref 4.0–10.5)
nRBC: 0 % (ref 0.0–0.2)

## 2018-06-11 LAB — COMPREHENSIVE METABOLIC PANEL
ALT: 165 U/L — ABNORMAL HIGH (ref 0–44)
AST: 132 U/L — ABNORMAL HIGH (ref 15–41)
Albumin: 3.6 g/dL (ref 3.5–5.0)
Alkaline Phosphatase: 122 U/L (ref 38–126)
Anion gap: 7 (ref 5–15)
BUN: 6 mg/dL (ref 6–20)
CO2: 20 mmol/L — ABNORMAL LOW (ref 22–32)
Calcium: 8.3 mg/dL — ABNORMAL LOW (ref 8.9–10.3)
Chloride: 111 mmol/L (ref 98–111)
Creatinine, Ser: 0.74 mg/dL (ref 0.44–1.00)
GFR calc Af Amer: >60 mL/min (ref >60)
GFR calc non Af Amer: >60 mL/min (ref >60)
Glucose, Bld: 108 mg/dL — ABNORMAL HIGH (ref 70–99)
Potassium: 3.6 mmol/L (ref 3.5–5.1)
Sodium: 138 mmol/L (ref 135–145)
Total Bilirubin: 1 mg/dL (ref 0.3–1.2)
Total Protein: 6.4 g/dL — ABNORMAL LOW (ref 6.5–8.1)

## 2018-06-11 SURGERY — ERCP, WITH INTERVENTION IF INDICATED
Anesthesia: General

## 2018-06-11 MED ORDER — GLYCOPYRROLATE PF 0.2 MG/ML IJ SOSY
PREFILLED_SYRINGE | INTRAMUSCULAR | Status: DC | PRN
  Administered 2018-06-11: .1 mg via INTRAVENOUS

## 2018-06-11 MED ORDER — DEXAMETHASONE SODIUM PHOSPHATE 10 MG/ML IJ SOLN
INTRAMUSCULAR | Status: DC | PRN
  Administered 2018-06-11: 5 mg via INTRAVENOUS

## 2018-06-11 MED ORDER — ONDANSETRON HCL 4 MG/2ML IJ SOLN
INTRAMUSCULAR | Status: DC | PRN
  Administered 2018-06-11: 4 mg via INTRAVENOUS

## 2018-06-11 MED ORDER — PROPOFOL 10 MG/ML IV BOLUS
INTRAVENOUS | Status: AC
  Filled 2018-06-11: qty 20

## 2018-06-11 MED ORDER — FENTANYL CITRATE (PF) 100 MCG/2ML IJ SOLN
INTRAMUSCULAR | Status: DC | PRN
  Administered 2018-06-11: 50 ug via INTRAVENOUS
  Administered 2018-06-11: 25 ug via INTRAVENOUS
  Administered 2018-06-11 (×2): 50 ug via INTRAVENOUS
  Administered 2018-06-11: 25 ug via INTRAVENOUS

## 2018-06-11 MED ORDER — OXYCODONE HCL 5 MG PO TABS: 5.0000 mg | ORAL_TABLET | Freq: Once | ORAL | Status: DC | PRN

## 2018-06-11 MED ORDER — MIDAZOLAM HCL 2 MG/2ML IJ SOLN
INTRAMUSCULAR | Status: AC
  Filled 2018-06-11: qty 2

## 2018-06-11 MED ORDER — ONDANSETRON HCL 4 MG/2ML IJ SOLN: 4.0000 mg | Freq: Once | INTRAMUSCULAR | Status: DC | PRN

## 2018-06-11 MED ORDER — LACTATED RINGERS IV SOLN
INTRAVENOUS | Status: DC
  Administered 2018-06-11: 09:00:00 via INTRAVENOUS

## 2018-06-11 MED ORDER — SODIUM CHLORIDE 0.9 % IV SOLN
INTRAVENOUS | Status: DC | PRN
  Administered 2018-06-11: 11:00:00 35 mL

## 2018-06-11 MED ORDER — PROPOFOL 10 MG/ML IV BOLUS
INTRAVENOUS | Status: DC | PRN
  Administered 2018-06-11: 100 mg via INTRAVENOUS

## 2018-06-11 MED ORDER — CIPROFLOXACIN IN D5W 400 MG/200ML IV SOLN
INTRAVENOUS | Status: AC
  Filled 2018-06-11: qty 200

## 2018-06-11 MED ORDER — ROCURONIUM BROMIDE 10 MG/ML (PF) SYRINGE
PREFILLED_SYRINGE | INTRAVENOUS | Status: DC | PRN
  Administered 2018-06-11: 10 mg via INTRAVENOUS
  Administered 2018-06-11: 40 mg via INTRAVENOUS

## 2018-06-11 MED ORDER — SODIUM CHLORIDE 0.9 % IV SOLN: INTRAVENOUS | Status: DC

## 2018-06-11 MED ORDER — LIDOCAINE 2% (20 MG/ML) 5 ML SYRINGE
INTRAMUSCULAR | Status: DC | PRN
  Administered 2018-06-11: 40 mg via INTRAVENOUS

## 2018-06-11 MED ORDER — INDOMETHACIN 50 MG RE SUPP
RECTAL | Status: DC | PRN
  Administered 2018-06-11: 100 mg via RECTAL

## 2018-06-11 MED ORDER — FENTANYL CITRATE (PF) 100 MCG/2ML IJ SOLN: 25.0000 ug | INTRAMUSCULAR | Status: DC | PRN

## 2018-06-11 MED ORDER — OXYCODONE HCL 5 MG/5ML PO SOLN: 5.0000 mg | Freq: Once | ORAL | Status: DC | PRN

## 2018-06-11 MED ORDER — MIDAZOLAM HCL 5 MG/5ML IJ SOLN
INTRAMUSCULAR | Status: DC | PRN
  Administered 2018-06-11: 2 mg via INTRAVENOUS

## 2018-06-11 MED ORDER — FENTANYL CITRATE (PF) 100 MCG/2ML IJ SOLN
INTRAMUSCULAR | Status: AC
  Filled 2018-06-11: qty 2

## 2018-06-11 MED ORDER — LACTATED RINGERS IV SOLN
INTRAVENOUS | Status: DC | PRN
  Administered 2018-06-11: 09:00:00 via INTRAVENOUS

## 2018-06-11 MED ORDER — GLUCAGON HCL RDNA (DIAGNOSTIC) 1 MG IJ SOLR
INTRAMUSCULAR | Status: DC | PRN
  Administered 2018-06-11: .5 mg via INTRAVENOUS
  Administered 2018-06-11 (×2): 0.25 mg via INTRAVENOUS

## 2018-06-11 MED ORDER — GLUCAGON HCL RDNA (DIAGNOSTIC) 1 MG IJ SOLR
INTRAMUSCULAR | Status: AC
  Filled 2018-06-11: qty 1

## 2018-06-11 MED ORDER — INDOMETHACIN 50 MG RE SUPP
RECTAL | Status: AC
  Filled 2018-06-11: qty 2

## 2018-06-11 MED ORDER — SUGAMMADEX SODIUM 200 MG/2ML IV SOLN
INTRAVENOUS | Status: DC | PRN
  Administered 2018-06-11: 150 mg via INTRAVENOUS

## 2018-06-11 NOTE — Plan of Care (Signed)
Pt stable though belly tenderness and nausea remains. Pt also remains anxious overall. Pt to have endoscopy this am for gall stones. No changes to note overall.

## 2018-06-11 NOTE — Progress Notes (Signed)
Pt stable with no needs at this time. Pt tolerating clear liquid diet well. No needs at this time. RN medicating pt for pain and other needs.

## 2018-06-11 NOTE — Op Note (Signed)
[facility] Patient Name: Claire Goodwin Procedure Date: 06/11/2018 MRN: [id number] Attending MD: Willis Modena , MD Date of Birth: 07-20-1965 CSN: [id number] Age: 53 Admit Type: Inpatient Procedure:                ERCP Indications:              Common bile duct stone(s) Providers:                Willis Modena, MD, Clearnce Sorrel, RN, Harrington Challenger,                            Technician, Anastasio Champion, CRNA Referring MD:             Dr. Ovidio Kin [facility] Surgery). Medicines:                General Anesthesia, Cipro 400 mg IV, Indomethacin                            100 mg PR, glucagon IV Complications:            No immediate complications. Estimated Blood Loss:     Estimated blood loss: none. Procedure:                Pre-Anesthesia Assessment:                           - Prior to the procedure, a History and Physical                            was performed, and patient medications and                            allergies were reviewed. The patient's tolerance of                            previous anesthesia was also reviewed. The risks                            and benefits of the procedure and the sedation                            options and risks were discussed with the patient.                            All questions were answered, and informed consent                            was obtained. Prior Anticoagulants: The patient has                            taken no previous anticoagulant or antiplatelet                            agents. ASA Grade Assessment: II - A patient with  mild systemic disease. After reviewing the risks                            and benefits, the patient was deemed in                            satisfactory condition to undergo the procedure.                           After obtaining informed consent, the scope was                            passed under direct vision. Throughout the                             procedure, the patient's blood pressure, pulse, and                            oxygen saturations were monitored continuously. The                            [id number]-[id number] ([id number] Olympus duodenoscope was                            introduced through the mouth, and used to inject                            contrast into and used to cannulate the bile duct.                            The ERCP was accomplished without difficulty. The                            patient tolerated the procedure well. Scope In: Scope Out: Findings:      A scout film of the abdomen was obtained. Surgical clips, consistent       with a previous cholecystectomy, were seen in the area of the right       upper quadrant of the abdomen. The esophagus was successfully intubated       under direct vision. The scope was advanced to a normal major papilla in       the descending duodenum without detailed examination of the pharynx,       larynx and associated structures, and upper GI tract. The upper GI tract       was grossly normal. Ampulla had bit of friable appearance, which can be       seen from bile duct stone passage; ampulla otherwise normal. The bile       duct was deeply cannulated. Contrast was injected. I personally       interpreted the bile duct images. Ductal flow of contrast was adequate.       Image quality was adequate. Contrast extended to the hepatic ducts. The       middle third of the main bile duct contained filling defects thought to       be stones. The biliary tree was otherwise normal.  A cholecystectomy had       been performed; no evidence of bile leak was noted. An 8 mm biliary       sphincterotomy was made with a traction (standard) sphincterotome using       blended current. There was no post-sphincterotomy bleeding. The biliary       tree was swept with a 12 mm balloon starting at the upper third of the       main bile duct, middle third of the main bile duct, lower third of the        main duct and bifurcation. Three stones were removed. No stones       remained. Bile flow per ampulla was good post-intervention.       Pancreatogram was not obtained (intentionally). Impression:               - Filling defects consistent with stones were seen                            on the cholangiogram.                           - The patient has had a cholecystectomy.                           - Choledocholithiasis was found. Complete removal                            was accomplished by biliary sphincterotomy and                            balloon extraction.                           - A biliary sphincterotomy was performed.                           - The biliary tree was swept. Moderate Sedation:      None Recommendation:           - Avoid aspirin and nonsteroidal anti-inflammatory                            medicines for 3 days.                           - Watch for pancreatitis, bleeding, perforation,                            and cholangitis.                           - Clear liquid diet today.                           [facility] will follow. Procedure Code(s):        --- Professional ---                           [phone], Endoscopic retrograde  cholangiopancreatography (ERCP); with removal of                            calculi/debris from biliary/pancreatic duct(s)                           [id number], Endoscopic retrograde                            cholangiopancreatography (ERCP); with                            sphincterotomy/papillotomy Diagnosis Code(s):        --- Professional ---                           Z90.49, Acquired absence of other specified parts                            of digestive tract                           K80.50, Calculus of bile duct without cholangitis                            or cholecystitis without obstruction                           R93.2, Abnormal findings on diagnostic imaging of                             liver and biliary tract CPT copyright 2019 [employer]. All rights reserved. The codes documented in this report are preliminary and upon coder review may  be revised to meet current compliance requirements. Willis Modena, MD 06/11/2018 11:14:35 AM This report has been signed electronically. Number of Addenda: 0

## 2018-06-11 NOTE — Progress Notes (Signed)
1 Day Post-Op   Subjective/Chief Complaint: No complaints   Objective: Vital signs in last 24 hours: Temp:  [96.7 F (35.9 C)-98.2 F (36.8 C)] 98.2 F (36.8 C) (05/23 0447) Pulse Rate:  [80-102] 88 (05/23 0447) Resp:  [14-23] 18 (05/23 0447) BP: (108-160)/(75-99) 117/75 (05/23 0447) SpO2:  [98 %-100 %] 98 % (05/23 0447) Weight:  [49.9 kg] 49.9 kg (05/22 0930) Last BM Date: 06/09/18  Intake/Output from previous day: 05/22 0701 - 05/23 0700 In: 3221.3 [P.O.:840; I.V.:2181.2; IV Piggyback:200.1] Out: 2470 [Urine:2450; Blood:20] Intake/Output this shift: Total I/O In: 1416.1 [P.O.:360; I.V.:855.9; IV Piggyback:200.1] Out: 1900 [Urine:1900]  General appearance: alert and cooperative Resp: clear to auscultation bilaterally Cardio: regular rate and rhythm GI: soft, minimal tenderness  Lab Results:  Recent Labs    06/09/18 1858 06/11/18 0410  WBC 11.3* 15.6*  HGB 13.7 13.1  HCT 42.1 41.1  PLT 386 353   BMET Recent Labs    06/09/18 1858 06/11/18 0410  NA 138 138  K 3.7 3.6  CL 109 111  CO2 21* 20*  GLUCOSE 90 108*  BUN 13 6  CREATININE 0.86 0.74  CALCIUM 8.5* 8.3*   PT/INR No results for input(s): LABPROT, INR in the last 72 hours. ABG No results for input(s): PHART, HCO3 in the last 72 hours.  Invalid input(s): PCO2, PO2  Studies/Results: Dg Cholangiogram Operative  Result Date: 06/10/2018 CLINICAL DATA:  Intraoperative cholangiogram during laparoscopic cholecystectomy. EXAM: INTRAOPERATIVE CHOLANGIOGRAM FLUOROSCOPY TIME:  13 second COMPARISON:  Right upper quadrant abdominal ultrasound-06/09/2018 FINDINGS: Intraoperative cholangiographic images of the right upper abdominal quadrant during laparoscopic cholecystectomy are provided for review. Surgical clips overlie the expected location of the gallbladder fossa. Contrast injection demonstrates selective cannulation of the central aspect of the cystic duct. There is passage of contrast through the central  aspect of the cystic duct with filling of a non dilated common bile duct. There is passage of contrast though the CBD and into the descending portion of the duodenum. There is minimal reflux of injected contrast into the common hepatic duct and central aspect of the non dilated intrahepatic biliary system. There are multiple (at least 7) persistent nonocclusive filling defects within the CBD suggestive of nonocclusive choledocholithiasis. IMPRESSION: Multiple nonocclusive filling defects within the CBD worrisome for nonocclusive choledocholithiasis. Electronically Signed   By: Simonne ComeJohn  Watts M.D.   On: 06/10/2018 11:34   [place] Abdomen Limited Ruq  Result Date: 06/09/2018 CLINICAL DATA:  Initial evaluation for acute right upper quadrant abdominal pain. EXAM: ULTRASOUND ABDOMEN LIMITED RIGHT UPPER QUADRANT COMPARISON:  None. FINDINGS: Gallbladder: Multiple small layering echogenic stones present within the gallbladder lumen. Gallbladder wall measure within normal limits at 2.5 mm. No free pericholecystic fluid. No sonographic Murphy sign elicited on exam. Common bile duct: Diameter: 8.5 mm.  No visible choledocholithiasis. Liver: No focal lesion identified. Within normal limits in parenchymal echogenicity. Portal vein is patent on color Doppler imaging with normal direction of blood flow towards the liver. IMPRESSION: 1. Cholelithiasis, with no other sonographic features to suggest acute cholecystitis. 2. Dilatation of the common bile duct up to 8.5 mm. No visible choledocholithiasis by sonography. Electronically Signed   By: Rise MuBenjamin  McClintock M.D.   On: 06/09/2018 20:06    Anti-infectives: Anti-infectives (From admission, onward)   Start     Dose/Rate Route Frequency Ordered Stop   06/10/18 2200  ciprofloxacin (CIPRO) IVPB 400 mg    Note to Pharmacy:  Continue coverage till she has ERCP, you can set the times  400 mg 200 mL/hr over 60 Minutes Intravenous Every 12 hours 06/10/18 1432     06/10/18 1430   ciprofloxacin (CIPRO) IVPB 400 mg  Status:  Discontinued     400 mg 200 mL/hr over 60 Minutes Intravenous Every 12 hours 06/10/18 1430 06/10/18 1432   06/10/18 1000  ciprofloxacin (CIPRO) IVPB 500 mg  Status:  Discontinued     500 mg 250 mL/hr over 60 Minutes Intravenous  Once 06/10/18 0941 06/10/18 0946   06/10/18 1000  ciprofloxacin (CIPRO) IVPB 400 mg     400 mg 200 mL/hr over 60 Minutes Intravenous  Once 06/10/18 0946 06/10/18 1023   06/10/18 0944  ciprofloxacin (CIPRO) 400 MG/200ML IVPB    Note to Pharmacy:  Montel Clock   : cabinet override      06/10/18 0944 06/10/18 1003      Assessment/Plan: s/p Procedure(s): LAPAROSCOPIC CHOLECYSTECTOMY WITH INTRAOPERATIVE CHOLANGIOGRAM (N/A) HERNIA REPAIR UMBILICAL ADULT pod 1 from lap chole with retained cbd stones  For ERCP today Hopefully home later today or tomorrow  LOS: 0 days    Chevis Pretty III 06/11/2018

## 2018-06-11 NOTE — Transfer of Care (Signed)
Immediate Anesthesia Transfer of Care Note  Patient: Claire Goodwin  Procedure(s) Performed: ENDOSCOPIC RETROGRADE CHOLANGIOPANCREATOGRAPHY (ERCP) (N/A ) SPHINCTEROTOMY REMOVAL OF STONES  Patient Location: PACU  Anesthesia Type:General  Level of Consciousness: awake, alert , oriented and patient cooperative  Airway & Oxygen Therapy: Patient Spontanous Breathing and Patient connected to face mask oxygen  Post-op Assessment: Report given to RN, Post -op Vital signs reviewed and stable and Patient moving all extremities X 4  Post vital signs: stable  Last Vitals:  Vitals Value Taken Time  BP 118/88 06/11/2018 11:30 AM  Temp    Pulse 104 06/11/2018 11:31 AM  Resp 14 06/11/2018 11:31 AM  SpO2 100 % 06/11/2018 11:31 AM  Vitals shown include unvalidated device data.  Last Pain:  Vitals:   06/11/18 0923  TempSrc: Oral  PainSc: 6       Patients Stated Pain Goal: 2 (06/10/18 2106)  Complications: No apparent anesthesia complications

## 2018-06-11 NOTE — Interval H&P Note (Signed)
History and Physical Interval Note:  06/11/2018 9:24 AM  Claire Goodwin  has presented today for surgery, with the diagnosis of CBD stones.  The various methods of treatment have been discussed with the patient and family. After consideration of risks, benefits and other options for treatment, the patient has consented to  Procedure(s): ENDOSCOPIC RETROGRADE CHOLANGIOPANCREATOGRAPHY (ERCP) (N/A) as a surgical intervention.  The patient's history has been reviewed, patient examined, no change in status, stable for surgery.  I have reviewed the patient's chart and labs.  Questions were answered to the patient's satisfaction.     Claire Goodwin  Assessment:  1.  Elevated LFTs. 2.  Bile duct stones on intraoperative cholangiogram. 3.  Symptomatic cholelithiasis and chronic cholecystitis, cholecystectomy POD-1. 4.  Umbilical hernia repair POD-1.  Plan:  1.  Endoscopic retrograde cholangiopancreatography for hopeful biliary sphincterotomy and bile duct stone extraction. 2.  Risks (up to and including bleeding, infection, perforation, pancreatitis that can be complicated by infected necrosis and death), benefits (removal of stones, alleviating blockage, decreasing risk of cholangitis or choledocholithiasis-related pancreatitis), and alternatives (watchful waiting, percutaneous transhepatic cholangiography) of ERCP were explained to patient/family in detail and patient elects to proceed.

## 2018-06-11 NOTE — Progress Notes (Signed)
Pt down to endoscopy in stable condition. No changes to note overall.

## 2018-06-11 NOTE — Anesthesia Procedure Notes (Signed)
Procedure Name: Intubation Date/Time: 06/11/2018 9:52 AM Performed by: Lissa Morales, CRNA Pre-anesthesia Checklist: Patient identified, Emergency Drugs available, Suction available and Patient being monitored Patient Re-evaluated:Patient Re-evaluated prior to induction Oxygen Delivery Method: Circle system utilized Preoxygenation: Pre-oxygenation with 100% oxygen Induction Type: IV induction Ventilation: Mask ventilation without difficulty Laryngoscope Size: Mac and 4 Grade View: Grade II Tube type: Oral Tube size: 7.0 mm Number of attempts: 1 Airway Equipment and Method: Stylet and Oral airway Placement Confirmation: ETT inserted through vocal cords under direct vision,  positive ETCO2 and breath sounds checked- equal and bilateral Secured at: 21 cm Tube secured with: Tape Dental Injury: Teeth and Oropharynx as per pre-operative assessment

## 2018-06-11 NOTE — Progress Notes (Signed)
Pt back from endo in stable condition. Though pt has periods of drowsiness (pt is easily arousable). Pt was able to walk to bathroom but staggered back to bed. Rn encouraged pt to rest and relax. Pt did c/o headache and ice pack was placed as well as pt eye mask. Rn will hold pain meds for now until pt is more arousable. No changes to note overall.

## 2018-06-11 NOTE — Anesthesia Preprocedure Evaluation (Signed)
Anesthesia Evaluation  Patient identified by MRN, date of birth, ID band Patient awake    Reviewed: Allergy & Precautions, NPO status , Patient's Chart, lab work & pertinent test results  Airway Mallampati: II  TM Distance: >3 FB Neck ROM: Full    Dental  (+) Teeth Intact, Dental Advisory Given   Pulmonary    breath sounds clear to auscultation       Cardiovascular hypertension,  Rhythm:Regular Rate:Normal     Neuro/Psych    GI/Hepatic   Endo/Other    Renal/GU      Musculoskeletal   Abdominal   Peds  Hematology   Anesthesia Other Findings   Reproductive/Obstetrics                             Anesthesia Physical Anesthesia Plan  ASA: II  Anesthesia Plan: General   Post-op Pain Management:    Induction: Intravenous  PONV Risk Score and Plan: Ondansetron and Dexamethasone  Airway Management Planned: Oral ETT  Additional Equipment:   Intra-op Plan:   Post-operative Plan: Extubation in OR  Informed Consent: I have reviewed the patients History and Physical, chart, labs and discussed the procedure including the risks, benefits and alternatives for the proposed anesthesia with the patient or authorized representative who has indicated his/her understanding and acceptance.     Dental advisory given  Plan Discussed with: CRNA and Anesthesiologist  Anesthesia Plan Comments:         Anesthesia Quick Evaluation  

## 2018-06-12 MED ORDER — OXYCODONE HCL 5 MG PO TABS: 5.0000 mg | ORAL_TABLET | Freq: Four times a day (QID) | ORAL | 0 refills | Status: DC | PRN

## 2018-06-12 NOTE — Progress Notes (Signed)
Subjective: Abdominal soreness. Otherwise without complaints  Objective: Vital signs in last 24 hours: Temp:  [98.2 F (36.8 C)-98.5 F (36.9 C)] 98.5 F (36.9 C) (05/24 0455) Pulse Rate:  [92-105] 92 (05/24 0455) Resp:  [14-23] 16 (05/24 0455) BP: (112-134)/(77-89) 134/80 (05/24 0455) SpO2:  [97 %-100 %] 98 % (05/24 0455) Weight change:  Last BM Date: 06/09/18  PE: GEN:  NAD ABD:  Soft, mild incisional tenderness, no peritonitis  Lab Results: CBC    Component Value Date/Time   WBC 15.6 (H) 06/11/2018 0410   RBC 4.29 06/11/2018 0410   HGB 13.1 06/11/2018 0410   HCT 41.1 06/11/2018 0410   PLT 353 06/11/2018 0410   MCV 95.8 06/11/2018 0410   MCH 30.5 06/11/2018 0410   MCHC 31.9 06/11/2018 0410   RDW 13.0 06/11/2018 0410   LYMPHSABS 2.0 06/09/2018 1858   MONOABS 1.0 06/09/2018 1858   EOSABS 0.2 06/09/2018 1858   BASOSABS 0.1 06/09/2018 1858   CMP     Component Value Date/Time   NA 138 06/11/2018 0410   K 3.6 06/11/2018 0410   CL 111 06/11/2018 0410   CO2 20 (L) 06/11/2018 0410   GLUCOSE 108 (H) 06/11/2018 0410   BUN 6 06/11/2018 0410   CREATININE 0.74 06/11/2018 0410   CREATININE 0.67 03/13/2013 0838   CALCIUM 8.3 (L) 06/11/2018 0410   PROT 6.4 (L) 06/11/2018 0410   ALBUMIN 3.6 06/11/2018 0410   AST 132 (H) 06/11/2018 0410   ALT 165 (H) 06/11/2018 0410   ALKPHOS 122 06/11/2018 0410   BILITOT 1.0 06/11/2018 0410   GFRNONAA >60 06/11/2018 0410   GFRNONAA >89 03/13/2013 0838   GFRAA >60 06/11/2018 0410   GFRAA >89 03/13/2013 0838   Assessment:  1.  Cholelithiasis + chronic cholecystitis, POD-2. 2.  Choledocholithiasis, POD-1 ERCP with bile duct stone extraction. 3.  Elevated LFTs, likely from #2 above.  Plan:  1.  No obvious post-ERCP complications:  OK with hospital discharge today from a GI perspective. 2.  Patient can follow-up with [facility] on an as-needed basis, happy to see her back as necessary. 3.  [facility] will sign-off; please call us with  any questions; thank you for the consultation.   Claire Goodwin 06/12/2018, 10:03 AM   Cell [phone] If no answer or after 5 PM call [phone]

## 2018-06-12 NOTE — Anesthesia Postprocedure Evaluation (Signed)
Anesthesia Post Note  Patient: Claire Goodwin  Procedure(s) Performed: ENDOSCOPIC RETROGRADE CHOLANGIOPANCREATOGRAPHY (ERCP) (N/A ) SPHINCTEROTOMY REMOVAL OF STONES     Anesthesia Type: General    Last Vitals:  Vitals:   06/11/18 2018 06/12/18 0455  BP: 120/89 134/80  Pulse: 92 92  Resp: 18 16  Temp: 36.8 C 36.9 C  SpO2: 100% 98%    Last Pain:  Vitals:   06/12/18 0455  TempSrc: Oral  PainSc:                  Avangelina Flight COKER

## 2018-06-12 NOTE — Progress Notes (Signed)
1 Day Post-Op    Subjective/Chief Complaint: No complaints other than soreness. ERCP yesterday was successful   Objective: Vital signs in last 24 hours: Temp:  [98.2 F (36.8 C)-98.5 F (36.9 C)] 98.5 F (36.9 C) (05/24 0455) Pulse Rate:  [92-105] 92 (05/24 0455) Resp:  [14-23] 16 (05/24 0455) BP: (112-134)/(77-89) 134/80 (05/24 0455) SpO2:  [97 %-100 %] 98 % (05/24 0455) Last BM Date: 06/09/18  Intake/Output from previous day: 05/23 0701 - 05/24 0700 In: 3473.4 [P.O.:2160; I.V.:1313.4] Out: 3700 [Urine:3700] Intake/Output this shift: No intake/output data recorded.  General appearance: alert and cooperative Resp: clear to auscultation bilaterally Cardio: regular rate and rhythm GI: soft, mild tenderness. incisions look good  Lab Results:  Recent Labs    06/09/18 1858 06/11/18 0410  WBC 11.3* 15.6*  HGB 13.7 13.1  HCT 42.1 41.1  PLT 386 353   BMET Recent Labs    06/09/18 1858 06/11/18 0410  NA 138 138  K 3.7 3.6  CL 109 111  CO2 21* 20*  GLUCOSE 90 108*  BUN 13 6  CREATININE 0.86 0.74  CALCIUM 8.5* 8.3*   PT/INR No results for input(s): LABPROT, INR in the last 72 hours. ABG No results for input(s): PHART, HCO3 in the last 72 hours.  Invalid input(s): PCO2, PO2  Studies/Results: Dg Cholangiogram Operative  Result Date: 06/10/2018 CLINICAL DATA:  Intraoperative cholangiogram during laparoscopic cholecystectomy. EXAM: INTRAOPERATIVE CHOLANGIOGRAM FLUOROSCOPY TIME:  13 second COMPARISON:  Right upper quadrant abdominal ultrasound-06/09/2018 FINDINGS: Intraoperative cholangiographic images of the right upper abdominal quadrant during laparoscopic cholecystectomy are provided for review. Surgical clips overlie the expected location of the gallbladder fossa. Contrast injection demonstrates selective cannulation of the central aspect of the cystic duct. There is passage of contrast through the central aspect of the cystic duct with filling of a non dilated  common bile duct. There is passage of contrast though the CBD and into the descending portion of the duodenum. There is minimal reflux of injected contrast into the common hepatic duct and central aspect of the non dilated intrahepatic biliary system. There are multiple (at least 7) persistent nonocclusive filling defects within the CBD suggestive of nonocclusive choledocholithiasis. IMPRESSION: Multiple nonocclusive filling defects within the CBD worrisome for nonocclusive choledocholithiasis. Electronically Signed   By: Simonne ComeJohn  Watts M.D.   On: 06/10/2018 11:34   Dg Ercp Biliary & Pancreatic Ducts  Result Date: 06/11/2018 CLINICAL DATA:  53 year old female with a history of choledocholithiasis EXAM: ERCP TECHNIQUE: Multiple spot images obtained with the fluoroscopic device and submitted for interpretation post-procedure. FLUOROSCOPY TIME:  Fluoroscopy Time:  3 minutes 42 seconds COMPARISON:  06/10/2018 FINDINGS: Limited images during ERCP. Initial image demonstrates endoscope projecting over the upper abdomen with cannulation of the ampulla and retrograde contrast infusion. Deployment of a retrieval balloon. Surgical changes of cholecystectomy. IMPRESSION: Limited images during ERCP demonstrates treatment of choledocholithiasis with deployment of a retrieval balloon. Please refer to the dictated operative report for full details of intraoperative findings and procedure. Electronically Signed   By: Gilmer MorJaime  Wagner D.O.   On: 06/11/2018 11:55    Anti-infectives: Anti-infectives (From admission, onward)   Start     Dose/Rate Route Frequency Ordered Stop   06/10/18 2200  ciprofloxacin (CIPRO) IVPB 400 mg  Status:  Discontinued    Note to Pharmacy:  Continue coverage till she has ERCP, you can set the times   400 mg 200 mL/hr over 60 Minutes Intravenous Every 12 hours 06/10/18 1432 06/11/18 1326   06/10/18 1430  ciprofloxacin (CIPRO) IVPB 400 mg  Status:  Discontinued     400 mg 200 mL/hr over 60 Minutes  Intravenous Every 12 hours 06/10/18 1430 06/10/18 1432   06/10/18 1000  ciprofloxacin (CIPRO) IVPB 500 mg  Status:  Discontinued     500 mg 250 mL/hr over 60 Minutes Intravenous  Once 06/10/18 0941 06/10/18 0946   06/10/18 1000  ciprofloxacin (CIPRO) IVPB 400 mg     400 mg 200 mL/hr over 60 Minutes Intravenous  Once 06/10/18 0946 06/10/18 1023   06/10/18 0944  ciprofloxacin (CIPRO) 400 MG/200ML IVPB    Note to Pharmacy:  Montel Clock   : cabinet override      06/10/18 0944 06/10/18 1003      Assessment/Plan: s/p Procedure(s): ENDOSCOPIC RETROGRADE CHOLANGIOPANCREATOGRAPHY (ERCP) (N/A) SPHINCTEROTOMY REMOVAL OF STONES Advance diet Discharge  LOS: 1 day    Chevis Pretty III 06/12/2018

## 2018-06-14 ENCOUNTER — Encounter (HOSPITAL_COMMUNITY): Payer: Self-pay | Admitting: Gastroenterology

## 2019-05-23 IMAGING — US ULTRASOUND ABDOMEN LIMITED
1 series · 14 of 25 positions shown · non-contrast
Comparison: None.

CLINICAL DATA: Initial evaluation for acute right upper quadrant
abdominal pain.

EXAM:
ULTRASOUND ABDOMEN LIMITED RIGHT UPPER QUADRANT

[Series 1: ultrasound abdomen limited · 14 of 77 slices shown]
[im 1/77]
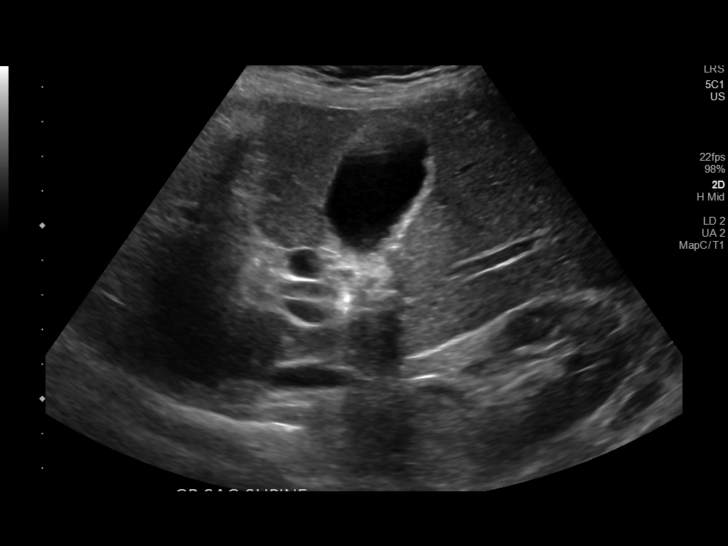
[im 7/77]
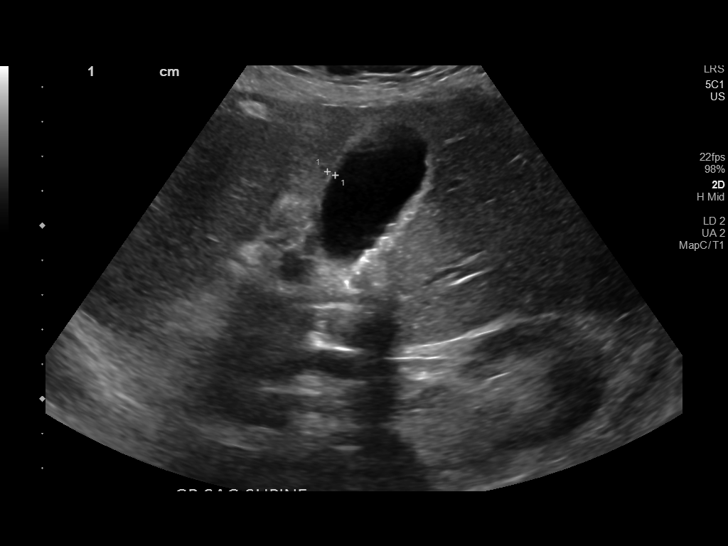
[im 13/77]
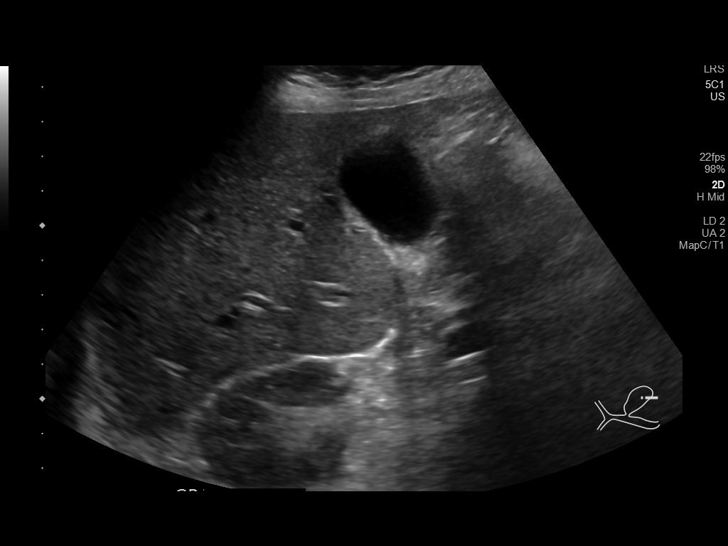
[im 20/77]
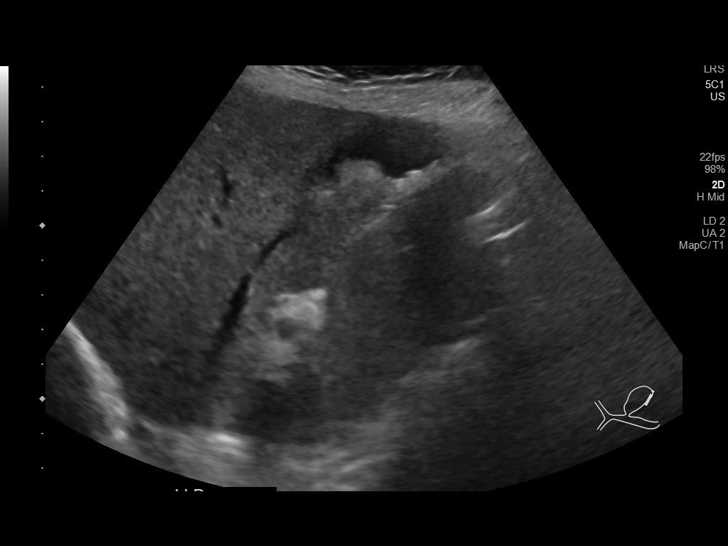
[im 26/77]
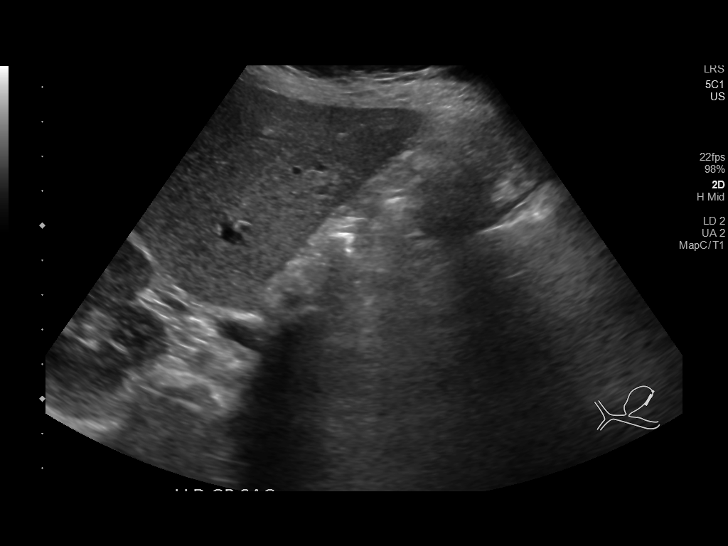
[im 29/77]
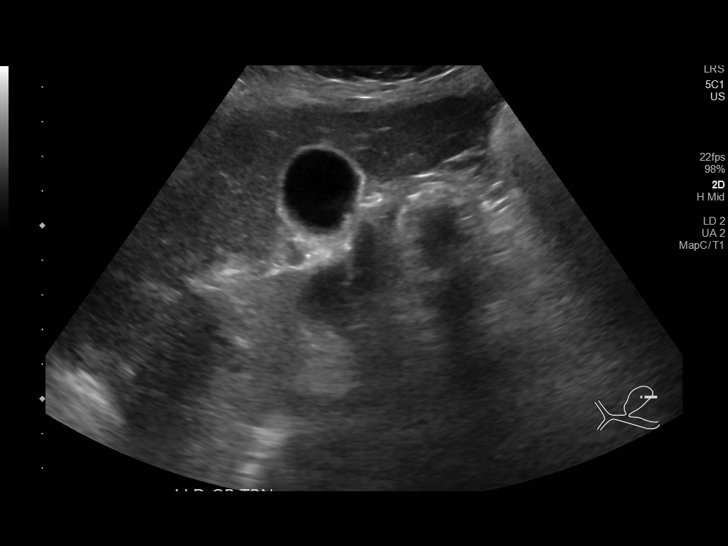
[im 35/77]
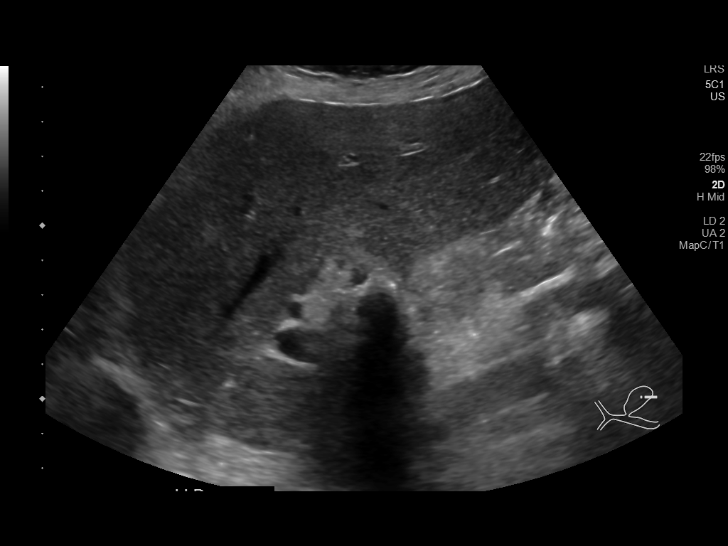
[im 42/77]
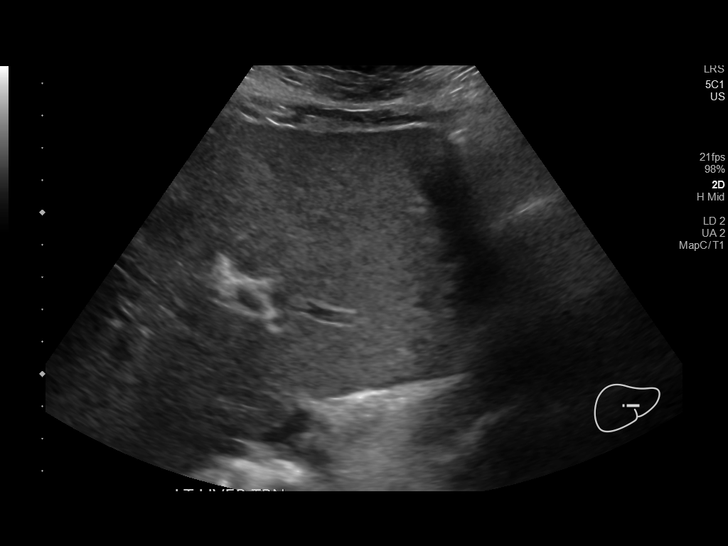
[im 48/77]
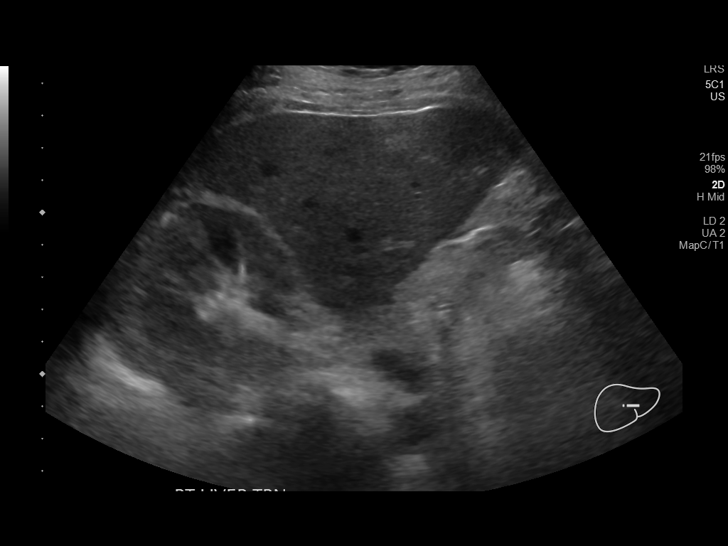
[im 51/77]
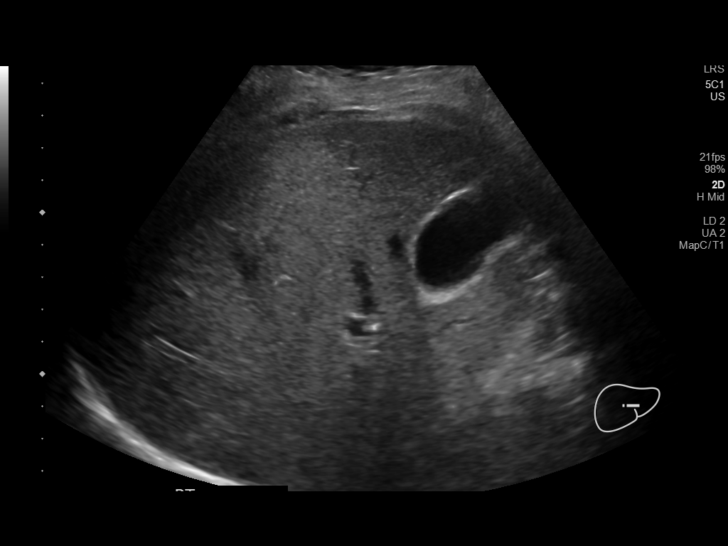
[im 58/77]
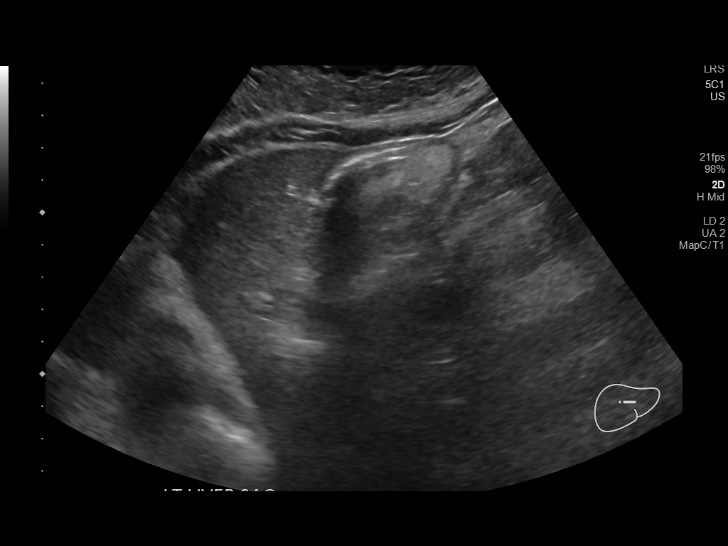
[im 64/77]
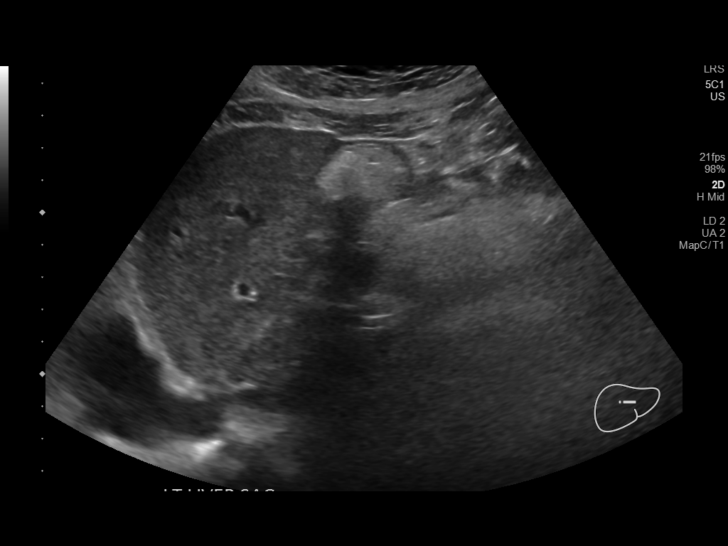
[im 70/77]
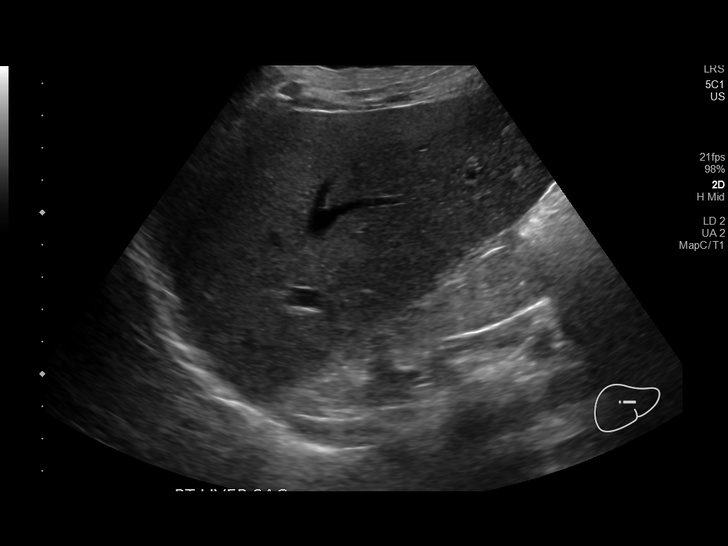
[im 77/77]
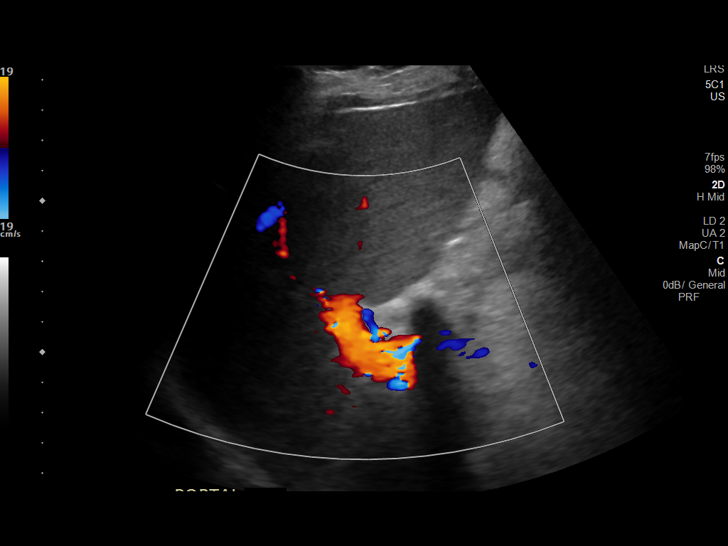

[14 of 25 positions shown; findings below may reference images not displayed]

FINDINGS: Gallbladder:

Multiple small layering echogenic stones present within the
gallbladder lumen. Gallbladder wall measure within normal limits at
2.5 mm. No free pericholecystic fluid. No sonographic Murphy sign
elicited on exam.

Common bile duct:

Diameter: 8.5 mm.  No visible choledocholithiasis.

Liver:

No focal lesion identified. Within normal limits in parenchymal
echogenicity. Portal vein is patent on color Doppler imaging with
normal direction of blood flow towards the liver.
IMPRESSION: 1. Cholelithiasis, with no other sonographic features to suggest
acute cholecystitis.
2. Dilatation of the common bile duct up to 8.5 mm. No visible
choledocholithiasis by sonography.

## 2022-08-06 ENCOUNTER — Other Ambulatory Visit: Payer: 59 | Attending: Urology | Admitting: Urology

## 2022-08-06 ENCOUNTER — Encounter: Payer: Self-pay | Admitting: Urology

## 2022-08-06 VITALS — BP 145/73 | HR 92 | Ht 61.5 in | Wt 128.1 lb

## 2022-08-06 DIAGNOSIS — R300 Dysuria: Secondary | ICD-10-CM | POA: Insufficient documentation

## 2022-08-06 DIAGNOSIS — N952 Postmenopausal atrophic vaginitis: Secondary | ICD-10-CM | POA: Insufficient documentation

## 2022-08-06 DIAGNOSIS — N393 Stress incontinence (female) (male): Secondary | ICD-10-CM | POA: Insufficient documentation

## 2022-08-06 LAB — URINALYSIS
Bilirubin, UA: NEGATIVE
Glucose, UA: NEGATIVE mg/dL
Hemoglobin, UA: NEGATIVE
Ketones, UA: NEGATIVE MG/DL
Leukocyte Esterase, UA: NEGATIVE
Nitrite, UA: NEGATIVE
Protein, UA: 10 mg/dL — AB
RBC, UA: 1 /[HPF] (ref 0–3)
Specific Grav, UA: 1.028 (ref 1.003–1.030)
Squamous Epithelial, UA: <1 /[HPF] (ref 0–10)
Urobilinogen, UA: <2 mg/dL (ref <2)
WBC, UA: <1 /[HPF] (ref 0–5)
pH, UA: 6 (ref 5.0–8.0)

## 2022-08-06 MED ORDER — ESTRADIOL 0.1 MG/GM VA CREA
1.0000 g | TOPICAL_CREAM | Freq: Every day | VAGINAL | 0 refills | Status: DC
Start: 2022-08-06 — End: 2022-09-22

## 2022-08-06 MED ORDER — ALBUTEROL SULFATE 108 (90 BASE) MCG/ACT IN AERS: INHALATION_SPRAY | RESPIRATORY_TRACT | Status: AC

## 2022-08-06 MED ORDER — HYDROCORTISONE 2.5 % EX CREA: TOPICAL_CREAM | CUTANEOUS | Status: AC

## 2022-08-06 MED ORDER — MOMETASONE FUROATE 50 MCG/ACT NA SUSP: NASAL | Status: AC

## 2022-08-06 MED ORDER — NAPROXEN 500 MG OR TABS
500.00 mg | ORAL_TABLET | ORAL | Status: AC
Start: 2021-09-18 — End: ?

## 2022-08-06 NOTE — Progress Notes (Signed)
[facility] New Patient Note    Referred By: Dr. Tresea Mall, MD    Date of Encounter: 08/06/22    Chief Complaint:   Chief Complaint   Patient presents with    New Patient     Stress Incontinence       History of Present Illness:   Michele Sims is a 57 year old female with a history of right breast cancer presenting today for urinary incontinence.     Patient endorses stress incontinence for several years. She previously saw another doctor who prescribed Myrbetriq which she noted helped but symptoms resumed when she stopped taking it. She also noted weight gain and headaches while on Myrbetriq. She wears pads everyday. She is able to hold her urine but reports having no feeling and leaking while exercising.     Patient reports having cryotherapy for pre cancer when she was 24. She denies itching, dryness, or trouble urinating. She notes dyspareunia in the past but is not currently sexually active.     Of note, patient denies history of tobacco use or smoking.     Review of Systems:  A 14-point review of systems was performed and is described as above. Detailed ROS was negative unless otherwise noted above.    Past Medical History:  Past Medical History:   Diagnosis Date    Acute gastric ulcer without hemorrhage or perforation     Acute nasopharyngitis     Anemia     Asthma     Hiatal hernia     History of adenomatous polyp of colon     History of malignant neoplasm of right breast      Past Surgical History:  Past Surgical History:   Procedure Laterality Date    CESAREAN SECTION, UNKNOWN SCAR      CHOLECYSTECTOMY      Endoscopic Sinus Surgery      Right Breast Operation      TONSILLECTOMY       Family History:  Family History   Problem Relation Name Age of Onset    Prostate Cancer Father      Arthritis Sister      Prostate Cancer M Grandfather         Social History:  Social History     Socioeconomic History    Marital status: Divorced        CURRENT MEDS:  Current Outpatient Medications:     albuterol  108 (90 Base) MCG/ACT inhaler, Inhale by mouth., Disp: , Rfl:     estradiol (ESTRACE) 0.1 MG/GM vaginal cream, Insert 1 g vaginally daily. Follow package directions, Disp: 1 each, Rfl: 0    hydrocortisone 2.5 % cream, Apply topically., Disp: , Rfl:     mometasone (NASONEX) 50 MCG/ACT nasal spray, by Nasal route., Disp: , Rfl:     naproxen (NAPROSYN) 500 MG tablet, Take 1 tablet (500 mg) by mouth., Disp: , Rfl:     ALLERGIES:   Erythromycin and Erythromycin base    EXAM:  Blood pressure 145/73, pulse 92, height 5' 1.5" (1.562 m), weight 58.1 kg (128 lb 1.4 oz).  GEN: Well appearing, Alert. Well nourished  HEENT: normocephalic/atraumatic  PULM: No respiratory distress  ABD: Non-distended, Nontender  EXT: warm, well perfused. No cyanosis, clubbing, or edema.  SKIN: no petechiae or rash, complete skin exam not performed.  PELVIC: NEFG, vaginal epithelium atrophic, cervix normal, no adnexal masses appreciated on exam; rectovaginal exam confirms the above. No prolapse. Narrow urethra.  Post-Void Residual: Urethral meatus cleansed with betadine. Straight catheter easily passed through the urethra into the bladder. Sterile urine collected. PVR measures 20 cc.      Assessment and Plan: Michele Sims is a 57 year old female with a history of right breast cancer presenting today with    ICD-10-CM ICD-9-CM   1. Vaginal atrophy  N95.2 627.3   2. Stress incontinence  N39.3 IMO0002   3. Dysuria  R30.0 788.1     The diagnosis of stress incontinence was discussed with the patient. The etiology was explained. Treatment options were discussed including expectant management, pelvic floor exercises and surgical intervention including urethral bulking. Risks and benefits of surgery were briefly outlined. Coaptite would be better option than Bulkamid due to narrow urethra if bulking indicated.     Discussed vaginal estrogen once a week for vaginal atrophy.     Recommend UDS.     All questions answered in detail. I performed a detailed review of  previous records prior to this visit.     - Vaginal estrogen 1x a week  - UDS  - RTC for bulking if SUI on UDS (Coaptite, will need urethral dilators as urethral orifice is narrow)      Production assistant, radio  The notes I am recording reflect only actions made by and judgments taken by this provider, Dr. Maurice March, for whom I am scribing today.  I have performed no independent clinical work.    Patience Musca    ____________________________________________________________________    Provider Attestation for Scribed Note    As the attending provider, I agree with the scribed content.  Any changes or edits are noted in the text above.     Otho Perl

## 2022-08-07 LAB — URINE CULTURE: Culture Result: NO GROWTH

## 2022-08-26 ENCOUNTER — Ambulatory Visit: Payer: 59 | Admitting: Family Medicine

## 2022-08-26 DIAGNOSIS — N393 Stress incontinence (female) (male): Secondary | ICD-10-CM

## 2022-08-26 LAB — URINALYSIS 8, POINT OF CARE TESTING
Bld, UA POCT: NEGATIVE
Glucose, UA POCT: NEGATIVE mg/dL
Ketone, UA POCT: NEGATIVE mg/dL
Leukocytes, UA POCT: NEGATIVE
Nitrite, UA POCT: NEGATIVE
Protein, UA POCT: NEGATIVE mg/dL
Specific Gravity, UA (POCT): <1.005 (ref 1.003–1.030)
pH, UA POCT: 6.5 (ref 5.0–8.0)

## 2022-08-26 NOTE — Procedures (Signed)
MULTICHANNEL URODYNAMICS NOTE    Visit Date: 08/26/2022    History of Present Illness:  Michele Sims is a 57 year old female who presents for urodynamics.    Ordering provider: Dr. Maurice March     Urodynamics Procedure Note    Prior to the study the patient voided and was then catheterized revealing a post void residual of 20 mL.    Urodynamics  A multi-channel urodynamics was performed.  A 7 French dual-lumen catheter was passed into the bladder followed by passage of a vaginal tube to measure intraabdominal pressure, and the test was performed in sitting.  Her bladder was filled at 40 mL per minute with sterile water to a capacity of 366 mL.      Filling cystometry  During filling cystometry, the bladder was stable. Cough stress test was negative.  First sensation occurred at 25mL, first desire occurred at , and strong desire occurred at .  The patient perceived sensory urgency and pain at .  The filling phase of this study demonstrated  a compliant bladder.    Electromyography  An electromyogram was performed using surface perineal electrodes. EMG revealed normal recruitment with coughing, straining, and voluntary contraction of the pelvic floor. Patient was unable to void in the chair.     Valsalva leak point pressure  Stress urinary incontinence was tested by Valsalva and not detected despite Valsalva to Pabd of 124cm H2O with bladder filled to .. The vesical catheter was removed and stress urinary incontinence was not demonstrated.    Voiding phase  For the voiding phase of the study, the patient was unable to void in the procedure chair even with catheter removed.      Impression:  Filling Phase: normal compliance, normal capacity         Voiding Phase: unable to void in the chair    Detailed discussion will occur at follow up appointment.    Michele Sims) Michele Leeder, PA-C  Department of Obstetrics and Gynecology     Supervising MD: Dr. Maurice March

## 2022-08-31 ENCOUNTER — Encounter: Payer: Self-pay | Admitting: Urology

## 2022-08-31 MED ORDER — PHENAZOPYRIDINE HCL 200 MG OR TABS
200.0000 mg | ORAL_TABLET | Freq: Three times a day (TID) | ORAL | 0 refills | Status: AC
Start: 2022-08-31 — End: ?

## 2022-08-31 NOTE — Telephone Encounter (Signed)
Will do pyridium pad test. USD normal.

## 2022-09-01 ENCOUNTER — Encounter: Payer: Self-pay | Admitting: Family Medicine

## 2022-09-02 NOTE — Progress Notes (Unsigned)
New patient visit   Patient: Claire Goodwin   DOB: [birth year]   57 y.o. Female  MRN: [id number] Visit Date: 09/03/2022  Today's healthcare provider: Alfredia Ferguson, PA-C   No chief complaint on file.  Subjective    Claire Goodwin is a 57 y.o. female who presents today as a new patient to establish care.  HPI  ***  Past Medical History:  Diagnosis Date   Anxiety    Depression    GERD (gastroesophageal reflux disease)    History of migraine headaches    Hypertension    Strep throat    Past Surgical History:  Procedure Laterality Date   CESAREAN SECTION     CHOLECYSTECTOMY N/A 06/10/2018   Procedure: LAPAROSCOPIC CHOLECYSTECTOMY WITH INTRAOPERATIVE CHOLANGIOGRAM;  Surgeon: Ovidio Kin, MD;  Location: [facility];  Service: General;  Laterality: N/A;   ERCP N/A 06/11/2018   Procedure: ENDOSCOPIC RETROGRADE CHOLANGIOPANCREATOGRAPHY (ERCP);  Surgeon: Willis Modena, MD;  Location: [facility];  Service: Endoscopy;  Laterality: N/A;   REMOVAL OF STONES  06/11/2018   Procedure: REMOVAL OF STONES;  Surgeon: Willis Modena, MD;  Location: [facility];  Service: Endoscopy;;   SPHINCTEROTOMY  06/11/2018   Procedure: Dennison Mascot;  Surgeon: Willis Modena, MD;  Location: [facility];  Service: Endoscopy;;   TONSILLECTOMY     UMBILICAL HERNIA REPAIR  06/10/2018   Procedure: HERNIA REPAIR UMBILICAL ADULT;  Surgeon: Ovidio Kin, MD;  Location: [facility];  Service: General;;   Family Status  Relation Name Status   Mother  Alive   MGM  Deceased   Father  Alive  No partnership data on file   Family History  Problem Relation Age of Onset   Cancer Mother        Breast Cancer   Breast cancer Mother    Cancer Maternal Grandmother        Cervical and Colon Cancer    Social History   Socioeconomic History   Marital status: Married    Spouse name: Not on file   Number of children: Not on file   Years of education: Not on file   Highest education level: Not on file   Occupational History   Not on file  Tobacco Use   Smoking status: Never   Smokeless tobacco: Never  Vaping Use   Vaping status: Never Used  Substance and Sexual Activity   Alcohol use: Yes    Comment: Occasional   Drug use: No   Sexual activity: Yes  Other Topics Concern   Not on file  Social History Narrative   Marital Status:  Married ([place])   Children:  [id number]  Thurston Hole, Margaretmary Bayley)   Pets:  None    Living Situation: Lives with spouse and children   Occupation:  Pensions consultant    Education: Engineer, maintenance (IT) [employer] ([employer])    Tobacco Use/Exposure:  None    Alcohol Use:  Occasional   Drug Use:  None   Diet:  Regular   Exercise: Treadmill (3-5 x per week)    Hobbies: Reading    Social Determinants of Health   Financial Resource Strain: Not on file  Food Insecurity: Not on file  Transportation Needs: Not on file  Physical Activity: Not on file  Stress: Not on file  Social Connections: Unknown (12/04/2021)   [facility]   Social Network    Social Network: Not on file   Outpatient Medications Prior to Visit  Medication Sig  buPROPion (WELLBUTRIN XL) 300 MG 24 hr tablet Take 300 mg by mouth daily.   escitalopram (LEXAPRO) 20 MG tablet Take 1 tablet (20 mg total) by mouth daily. (Patient taking differently: Take 20 mg by mouth at bedtime. )   fexofenadine-pseudoephedrine (ALLEGRA-D 24) 180-240 MG 24 hr tablet Take 1 tablet by mouth daily as needed (allergies).   fluticasone (FLONASE) 50 MCG/ACT nasal spray Place 2 sprays into both nostrils daily.   Levonorgestrel-Ethinyl Estradiol (SEASONIQUE) 0.15-0.03 &0.01 MG tablet Take 1 tablet by mouth daily.   nadolol (CORGARD) 20 MG tablet Take 1 tablet (20 mg total) by mouth daily. (Patient not taking: Reported on 06/09/2018)   omeprazole-sodium bicarbonate (ZEGERID) 40-1100 MG per capsule Take 1 capsule by mouth daily before breakfast. (Patient not taking: Reported on 06/09/2018)   oxyCODONE (OXY  IR/ROXICODONE) 5 MG immediate release tablet Take 1-2 tablets (5-10 mg total) by mouth every 6 (six) hours as needed for moderate pain.   promethazine (PHENERGAN) 25 MG tablet Take 1 tablet (25 mg total) by mouth every 8 (eight) hours as needed for nausea or vomiting.   rizatriptan (MAXALT) 10 MG tablet Take 10 mg by mouth as needed for migraine. May repeat in 2 hours if needed   SUMAtriptan (IMITREX) 100 MG tablet Take 1 tablet (100 mg total) by mouth every 2 (two) hours as needed for migraine. (Patient not taking: Reported on 06/09/2018)   SUMAtriptan Succinate 6 MG/0.5ML SOTJ Use 1 device in place of a pill as directed for migraine headache. (Patient not taking: Reported on 06/09/2018)   SUMAtriptan-naproxen (TREXIMET) 85-500 MG per tablet Take 1 tablet by mouth every 2 (two) hours as needed for migraine. (Patient not taking: Reported on 06/09/2018)   zonisamide (ZONEGRAN) 100 MG capsule Take 300 mg by mouth daily.   No facility-administered medications prior to visit.   Allergies  Allergen Reactions   Cephalosporins Rash   Sulfa Antibiotics Rash    Immunization History  Administered Date(s) Administered   Influenza,inj,Quad PF,6+ Mos 11/25/2012   MMR 09/11/2004   Td 09/11/2004    Health Maintenance  Topic Date Due   Hepatitis C Screening  Never done   MAMMOGRAM  01/16/2014   PAP SMEAR-Modifier  08/20/2014   DTaP/Tdap/Td (2 - Tdap) 09/12/2014   Zoster Vaccines- Shingrix (2 of 2) 12/03/2016   COVID-19 Vaccine (1 - 2023-24 season) Never done   INFLUENZA VACCINE  08/20/2022   Colonoscopy  06/05/2024   HIV Screening  Completed   HPV VACCINES  Aged Out    Patient Care Team: Gillian Scarce, MD as PCP - General (Family Medicine)  Review of Systems  {Insert previous labs (optional):23779} {See past labs  Heme  Chem  Endocrine  Serology  Results Review (optional):1}   Objective    There were no vitals taken for this visit. {Insert last BP/Wt (optional):23777}{See vitals  history (optional):1}   Physical Exam ***  Depression Screen     No data to display         No results found for any visits on 09/03/22.  Assessment & Plan     ***  No follow-ups on file.     {provider attestation***:1}   Alfredia Ferguson, PA-C  [facility] [phone] (phone) [phone] (fax)  [facility]

## 2022-09-03 ENCOUNTER — Encounter: Payer: Self-pay | Admitting: Physician Assistant

## 2022-09-03 ENCOUNTER — Ambulatory Visit: Payer: PRIVATE HEALTH INSURANCE | Admitting: Physician Assistant

## 2022-09-03 VITALS — BP 106/62 | HR 79 | Temp 98.3°F | Resp 20 | Ht 58.66 in | Wt 116.2 lb

## 2022-09-03 DIAGNOSIS — N951 Menopausal and female climacteric states: Secondary | ICD-10-CM | POA: Diagnosis not present

## 2022-09-03 DIAGNOSIS — G43109 Migraine with aura, not intractable, without status migrainosus: Secondary | ICD-10-CM

## 2022-09-03 DIAGNOSIS — Z1231 Encounter for screening mammogram for malignant neoplasm of breast: Secondary | ICD-10-CM

## 2022-09-03 DIAGNOSIS — Z0000 Encounter for general adult medical examination without abnormal findings: Secondary | ICD-10-CM | POA: Diagnosis not present

## 2022-09-03 DIAGNOSIS — F411 Generalized anxiety disorder: Secondary | ICD-10-CM | POA: Diagnosis not present

## 2022-09-03 DIAGNOSIS — F330 Major depressive disorder, recurrent, mild: Secondary | ICD-10-CM

## 2022-09-03 DIAGNOSIS — E559 Vitamin D deficiency, unspecified: Secondary | ICD-10-CM

## 2022-09-03 DIAGNOSIS — K5900 Constipation, unspecified: Secondary | ICD-10-CM

## 2022-09-03 DIAGNOSIS — E538 Deficiency of other specified B group vitamins: Secondary | ICD-10-CM

## 2022-09-03 MED ORDER — ESTRADIOL 0.1 MG/GM VA CREA
TOPICAL_CREAM | VAGINAL | 1 refills | Status: DC
Start: 2022-09-03 — End: 2022-12-14

## 2022-09-03 MED ORDER — ESCITALOPRAM OXALATE 20 MG PO TABS
20.0000 mg | ORAL_TABLET | Freq: Every day | ORAL | 1 refills | Status: DC
Start: 2022-09-03 — End: 2023-03-24

## 2022-09-03 MED ORDER — ESCITALOPRAM OXALATE 20 MG PO TABS: 20.0000 mg | ORAL_TABLET | Freq: Every day | ORAL | 1 refills | Status: DC

## 2022-09-03 MED ORDER — BUPROPION HCL ER (XL) 300 MG PO TB24
300.0000 mg | ORAL_TABLET | Freq: Every day | ORAL | 3 refills | Status: DC
Start: 2022-09-03 — End: 2023-08-27

## 2022-09-03 MED ORDER — SYRINGE 25G X 1" 3 ML MISC
1 refills | Status: DC
Start: 2022-09-03 — End: 2023-08-27

## 2022-09-03 MED ORDER — CYANOCOBALAMIN 1000 MCG/ML IJ SOLN: 1000.0000 ug | INTRAMUSCULAR | 1 refills | Status: AC

## 2022-09-03 NOTE — Assessment & Plan Note (Signed)
Cont lexapro 20 mg , tolerating well

## 2022-09-03 NOTE — Assessment & Plan Note (Signed)
Self-administers monthly B12 injections. -Order new prescription for appropriate needles for B12 injections, pt was using insulin needle for IM injections.  -repeat vit b12 level

## 2022-09-03 NOTE — Assessment & Plan Note (Signed)
Using Estradiol cream twice weekly. -Continue current regimen.

## 2022-09-03 NOTE — Assessment & Plan Note (Signed)
Continue Wellbutrin 300 mg, tolerating well

## 2022-09-03 NOTE — Assessment & Plan Note (Signed)
Recheck level 

## 2022-09-03 NOTE — Assessment & Plan Note (Signed)
Reports migraines with aura, currently managed with quartered doses of Nadolol due to fatigue side effects. Previously used Maxalt, but discontinued due to concerns of stroke risk with aura migraines. Currently manages acute migraines with sleep and over-the-counter Dramamine. -Continue current management strategy with Nadolol and over-the-counter medications.

## 2022-09-22 ENCOUNTER — Other Ambulatory Visit: Payer: Self-pay | Admitting: Urology

## 2022-09-22 ENCOUNTER — Encounter: Payer: Self-pay | Admitting: Urology

## 2022-09-22 DIAGNOSIS — R300 Dysuria: Secondary | ICD-10-CM

## 2022-09-22 DIAGNOSIS — N393 Stress incontinence (female) (male): Secondary | ICD-10-CM

## 2022-09-22 MED ORDER — ESTRADIOL 0.1 MG/GM VA CREA
TOPICAL_CREAM | VAGINAL | 4 refills | Status: AC
Start: 2022-09-22 — End: ?

## 2022-09-23 NOTE — Telephone Encounter (Signed)
Spoke with patient reviewed instructions regarding pyridium pad test.  Patient understood.  Patient requesting price quote for procedure.  Per last note patient to have Coaptite.

## 2022-09-24 ENCOUNTER — Encounter: Payer: Self-pay | Admitting: Urology

## 2022-09-28 ENCOUNTER — Ambulatory Visit: Payer: 59 | Admitting: Obstetrics & Gynecology

## 2022-09-28 NOTE — Telephone Encounter (Signed)
Otho Perl, MD   to Lurlean Battistelli       08/31/22 10:54 AM  Hello Michele Sims -     After reviewing your urodynamic testing - there is no evidence of stress urinary incontinence. The testing is not 100% accurate so I would like to proceed with a pyridium pad test before your appointment on 9/12. For twenty four hours you will wear a panty liner. You will take 200 mg (sent to your pharmacy) of pyridium every 6 hours for three doses. This stains the urine [place]. Please keep all the panty liners in a zip lock back during this twenty four hour test and bring them with you to your appointment on 9/12. Please contact the office if you have any questions.     Best,  Dr. Maurice March     Last read by Michele Sims at  5:01 PM on 09/24/2022.

## 2022-09-28 NOTE — Telephone Encounter (Signed)
Spoke with patient, she will drop off pads to [employer] [facility] at her appointment.

## 2022-09-29 NOTE — Telephone Encounter (Signed)
Patient wanted to know the price estimate. I advised I ran her eligibility and it appears she has coverage through [facility]. She said yes her deductible has been met also. She thinks that she will go through insurance. She wanted to know how much the procedure would be without insurance. I advised I ran an estimate (CPT code 78295), roughly about $3k. She said she is probably going to use her insurance for her upcoming visit.

## 2022-09-30 ENCOUNTER — Ambulatory Visit (INDEPENDENT_AMBULATORY_CARE_PROVIDER_SITE_OTHER): Payer: 59 | Admitting: Family Medicine

## 2022-09-30 VITALS — BP 121/66 | HR 88 | Wt 127.9 lb

## 2022-09-30 DIAGNOSIS — N393 Stress incontinence (female) (male): Secondary | ICD-10-CM

## 2022-09-30 MED ORDER — DIAZEPAM 5 MG OR TABS
5.0000 mg | ORAL_TABLET | Freq: Once | ORAL | 0 refills | Status: AC
Start: 2022-09-30 — End: 2022-09-30

## 2022-09-30 NOTE — Progress Notes (Signed)
[facility] Patient Progress Note    Date of Encounter: 09/30/22     Chief Complaint:   Chief Complaint   Patient presents with    Bladder Problem       History of Present Illness:   Michele Sims is a 57 year old female with a history of right breast cancer and urinary incontinence presenting today for follow up.     She underwent UDS due to the unclear nature of her leakage. No leakage was seen on UDS however suspected to be stress incontinence given her leakage is generally with activity. She presents today to review results of pyridium pad test to ensure leakage is urine.     She brings with her 3 small pads all stained with a small amount of [place] urine. She notes that she was working from home and so this amount of leakage is minimal for her. When she is sneezing, coughing or dancing, she will completely soak a larger pad.     Review of Systems:  A 14-point review of systems was performed and is described as above. Detailed ROS was negative unless otherwise noted above.    Past Medical History:  Past Medical History:   Diagnosis Date    Acute gastric ulcer without hemorrhage or perforation     Acute nasopharyngitis     Anemia     Asthma     Hiatal hernia     History of adenomatous polyp of colon     History of malignant neoplasm of right breast      Past Surgical History:  Past Surgical History:   Procedure Laterality Date    CESAREAN SECTION, UNKNOWN SCAR      CHOLECYSTECTOMY      Endoscopic Sinus Surgery      Right Breast Operation      TONSILLECTOMY       Family History:  Family History   Problem Relation Name Age of Onset    Prostate Cancer Father      Arthritis Sister      Prostate Cancer M Grandfather         Social History:  Social History     Socioeconomic History    Marital status: Divorced        CURRENT MEDS:  Current Outpatient Medications:     albuterol 108 (90 Base) MCG/ACT inhaler, Inhale by mouth., Disp: , Rfl:     estradiol (ESTRACE) 0.1 MG/GM vaginal cream, INSERT 1 GRAM  VAGINALLY DAILY. FOLLOW PACKAGE DIRECTIONS, Disp: 42.5 g, Rfl: 4    hydrocortisone 2.5 % cream, Apply topically., Disp: , Rfl:     mometasone (NASONEX) 50 MCG/ACT nasal spray, by Nasal route., Disp: , Rfl:     naproxen (NAPROSYN) 500 MG tablet, Take 1 tablet (500 mg) by mouth., Disp: , Rfl:     phenazopyridine (PYRIDIUM) 200 MG tablet, Take 1 tablet (200 mg) by mouth 3 times daily., Disp: 3 tablet, Rfl: 0    ALLERGIES:   Erythromycin and Erythromycin base    EXAM:  Blood pressure 121/66, pulse 88, weight 58 kg (127 lb 13.9 oz).  GEN: Well appearing, Alert. Well nourished  HEENT: normocephalic/atraumatic  PULM: No respiratory distress  ABD: Non-distended, Nontender  EXT: warm, well perfused. No cyanosis, clubbing, or edema.  SKIN: no petechiae or rash, complete skin exam not performed.  PELVIC: deferred    Assessment and Plan: [name] is a 57 year old female with a history of right breast cancer presenting today with  ICD-10-CM ICD-9-CM   1. Stress incontinence  N39.3 [id number]       To proceed with bulking as schedule tomorrow given stained pads today.     Lamona Curl) Lakera Viall, PA-C  Department of Obstetrics and Gynecology     Supervising MD: Dr. Cecile Hearing

## 2022-10-01 ENCOUNTER — Encounter: Payer: Self-pay | Admitting: Urology

## 2022-10-01 ENCOUNTER — Ambulatory Visit (INDEPENDENT_AMBULATORY_CARE_PROVIDER_SITE_OTHER): Payer: 59 | Admitting: Urology

## 2022-10-01 VITALS — BP 127/77 | HR 76 | Temp 98.1°F | Resp 16 | Ht 61.5 in | Wt 128.3 lb

## 2022-10-01 DIAGNOSIS — N393 Stress incontinence (female) (male): Secondary | ICD-10-CM

## 2022-10-01 DIAGNOSIS — R300 Dysuria: Secondary | ICD-10-CM

## 2022-10-01 LAB — URINALYSIS 8, POINT OF CARE TESTING
Bld, UA POCT: NEGATIVE
Glucose, UA POCT: NEGATIVE mg/dL
Ketone, UA POCT: NEGATIVE mg/dL
Leukocytes, UA POCT: NEGATIVE
Nitrite, UA POCT: NEGATIVE
Protein, UA POCT: NEGATIVE mg/dL
Specific Gravity, UA (POCT): 1.015 (ref 1.003–1.030)
pH, UA POCT: 6.5 (ref 5.0–8.0)

## 2022-10-01 MED ORDER — CIPROFLOXACIN HCL 500 MG OR TABS
500.0000 mg | ORAL_TABLET | Freq: Once | ORAL | Status: AC
Start: 2022-10-01 — End: 2022-10-01
  Administered 2022-10-01: 500 mg via ORAL

## 2022-10-01 MED ORDER — CIPROFLOXACIN HCL 500 MG OR TABS
500.0000 mg | ORAL_TABLET | Freq: Once | ORAL | Status: DC
Start: 2022-10-01 — End: 2022-10-01

## 2022-10-01 MED ORDER — LIDOCAINE HCL 4 % EX SOLN
Freq: Once | CUTANEOUS | Status: DC
Start: 2022-10-01 — End: 2022-10-01

## 2022-10-01 MED ORDER — LIDOCAINE HCL 2% EX GEL (UROJET)
10.0000 mL | Freq: Once | Status: AC
Start: 2022-10-01 — End: 2022-10-01
  Administered 2022-10-01: 10 mL via URETHRAL

## 2022-10-01 MED ORDER — LIDOCAINE HCL 4 % EX SOLN
Freq: Once | CUTANEOUS | Status: AC
Start: 2022-10-01 — End: 2022-10-01
  Administered 2022-10-01: 50 mL

## 2022-10-01 MED ORDER — DIAZEPAM 5 MG OR TABS
ORAL_TABLET | ORAL | Status: AC
Start: 2022-09-30 — End: ?

## 2022-10-01 MED ORDER — LIDOCAINE HCL 2% EX GEL (UROJET)
10.0000 mL | Freq: Once | Status: DC
Start: 2022-10-01 — End: 2022-10-01

## 2022-10-01 NOTE — Progress Notes (Signed)
TRANSURETHRAL BULKING    Date of procedure: 10/01/22    HPI: Michele Sims is a 57 year old female with a history of right breast cancer and urinary incontinence.       Indications:  Stress urinary incontinence.  Lot number: see nursing notes  Expiration: see nursing notes  Injection sites: 2, 10, 5, and 7 o'clock  EBL: <5cc       Procedure:   Once the procedure had been thoroughly explained to the patient and informed consent was signed, she was placed in the dorsal lithotomy position. The external urethral meatus was cleansed with Betadine solution. Topical Xylocaine jelly was then placed transurethrally. A 30 scope was then placed with excellent visualization of the urethrovesical junction revealing a flaccid, open UVJ. 1 cc of Bulkamid material was injected at 2, 10, 5, and 7 o'clock, excellent coaptation was noted. The urethroscope was removed without difficulty. The patient tolerated the procedure well.  There were no complications. She voided without difficulty and was discharged home. She was instructed to contact the office with any problems including difficulty voiding, significant hematuria or any other problem. She was sent home in excellent condition.    Findings:  On urethroscopy, patient was noted to have a flaccid urethrovesical junction.  Following injection of bulking material, there was noted to be excellent coaptation of the urethral mucosa.     Patient tolerated the procedure and there were no complications.    Post procedure antibiotics: single dose 500 mg Cipro    Assessment/Plan:  - Findings noted above.  - Retention precautions reviewed  - Advised patient to be aware of hematuria and sensation of urgency when urinating; both normal after this procedure.  - If patient endorses less than 50% improvement, consider touch-up.   - FU in 3 weeks via telemedicine with PA     Scribe Attestation  The notes I am recording reflect only actions made by and judgments taken by this provider, Dr. Maurice March, for  whom I am scribing today.  I have performed no independent clinical work.    Patience Musca    ____________________________________________________________________    Provider Attestation for Scribed Note    As the attending provider, I agree with the scribed content.  Any changes or edits are noted in the text above.     Felicia L Lane\

## 2022-10-09 ENCOUNTER — Ambulatory Visit
Admission: RE | Admit: 2022-10-09 | Discharge: 2022-10-09 | Disposition: A | Discharge status: Home / Self Care | Payer: PRIVATE HEALTH INSURANCE | Source: Ambulatory Visit | Attending: Internal Medicine

## 2022-10-09 VITALS — BP 121/70 | HR 98 | Temp 98.8°F | Resp 16

## 2022-10-09 DIAGNOSIS — N3001 Acute cystitis with hematuria: Secondary | ICD-10-CM | POA: Diagnosis present

## 2022-10-09 LAB — POCT URINALYSIS DIP (MANUAL ENTRY)
Blood, UA: NEGATIVE
Glucose, UA: 100 mg/dL — AB
Leukocytes, UA: NEGATIVE
Nitrite, UA: POSITIVE — AB
Spec Grav, UA: >=1.03 — AB (ref 1.010–1.025)
Urobilinogen, UA: 1 E.U./dL
pH, UA: 5 (ref 5.0–8.0)

## 2022-10-09 MED ORDER — NITROFURANTOIN MONOHYD MACRO 100 MG PO CAPS
100.0000 mg | ORAL_CAPSULE | Freq: Two times a day (BID) | ORAL | 0 refills | Status: AC
Start: 2022-10-09 — End: 2022-10-16

## 2022-10-09 NOTE — ED Provider Notes (Addendum)
[facility]    CSN: [id number] Arrival date & time: 10/09/22  1439      History   Chief Complaint Chief Complaint  Patient presents with   Urinary Frequency    Entered by patient    HPI Claire Goodwin is a 57 y.o. female presents for dysuria.  Patient reports 2 days of urinary burning, urgency, frequency.  Denies fevers, hematuria, nausea/vomiting, flank pain.  No vaginal discharge or STD concern.  She has been taking Azo OTC for symptoms   Urinary Frequency    Past Medical History:  Diagnosis Date   Anxiety    Depression    GERD (gastroesophageal reflux disease)    History of migraine headaches    Hypertension    Strep throat     Patient Active Problem List   Diagnosis Date Noted   Menopausal vaginal dryness 09/03/2022   Vitamin B12 deficiency 09/03/2022   Vitamin D deficiency 09/03/2022   Cholelithiasis with chronic cholecystitis 06/09/2018   Biliary colic 06/09/2018   GAD (generalized anxiety disorder) 11/26/2012   Depression 11/26/2012   Migraine with aura 11/26/2012   Allergic rhinitis 11/26/2012   GERD (gastroesophageal reflux disease) 11/26/2012   Acne 11/26/2012    Past Surgical History:  Procedure Laterality Date   CESAREAN SECTION     CHOLECYSTECTOMY N/A 06/10/2018   Procedure: LAPAROSCOPIC CHOLECYSTECTOMY WITH INTRAOPERATIVE CHOLANGIOGRAM;  Surgeon: Ovidio Kin, MD;  Location: [facility];  Service: General;  Laterality: N/A;   ERCP N/A 06/11/2018   Procedure: ENDOSCOPIC RETROGRADE CHOLANGIOPANCREATOGRAPHY (ERCP);  Surgeon: Willis Modena, MD;  Location: [facility];  Service: Endoscopy;  Laterality: N/A;   REMOVAL OF STONES  06/11/2018   Procedure: REMOVAL OF STONES;  Surgeon: Willis Modena, MD;  Location: [facility];  Service: Endoscopy;;   SPHINCTEROTOMY  06/11/2018   Procedure: Dennison Mascot;  Surgeon: Willis Modena, MD;  Location: [facility];  Service: Endoscopy;;   TONSILLECTOMY     UMBILICAL HERNIA REPAIR  06/10/2018    Procedure: HERNIA REPAIR UMBILICAL ADULT;  Surgeon: Ovidio Kin, MD;  Location: [facility];  Service: General;;    OB History   No obstetric history on file.      Home Medications    Prior to Admission medications   Medication Sig Start Date End Date Taking? Authorizing Provider  nitrofurantoin, macrocrystal-monohydrate, (MACROBID) 100 MG capsule Take 1 capsule (100 mg total) by mouth 2 (two) times daily for 7 days. 10/09/22 10/16/22 Yes Radford Pax, NP  buPROPion (WELLBUTRIN XL) 300 MG 24 hr tablet Take 1 tablet (300 mg total) by mouth daily. 09/03/22   Alfredia Ferguson, PA-C  cyanocobalamin (VITAMIN B12) 1000 MCG/ML injection Inject 1 mL (1,000 mcg total) into the muscle every 30 (thirty) days. 09/03/22   Alfredia Ferguson, PA-C  cyclobenzaprine (FLEXERIL) 5 MG tablet Take by mouth. 04/01/21   [provider]  diphenhydrAMINE (BENADRYL) 25 mg capsule Take by mouth. 05/18/17   [provider]  escitalopram (LEXAPRO) 20 MG tablet Take 1 tablet (20 mg total) by mouth daily. 09/03/22 06/21/27  Alfredia Ferguson, PA-C  estradiol (ESTRACE) 0.1 MG/GM vaginal cream Insert 1 gram in vagina every Monday and Thursday for vaginal dryness 09/03/22   Drubel, Lillia Abed, PA-C  fluticasone [facility]) 50 MCG/ACT nasal spray Place 2 sprays into both nostrils daily. 08/22/13 06/09/18  Gillian Scarce, MD  ibuprofen (ADVIL) 800 MG tablet Take by mouth. 12/06/21   [provider]  Insulin Syringe-Needle U-100 (GLOBAL INJECT EASE INSULIN SYR) 30G X 1/2" 0.5 ML MISC  Use 1 syringe every 14 days 04/01/21   [provider]  Multiple Vitamin (MULTIVITAMIN) tablet Take 1 tablet by mouth daily.    [provider]  nadolol (CORGARD) 20 MG tablet Take 5 mg by mouth daily. 1/4 tablet daily    [provider]  Syringe/Needle, Disp, (SYRINGE 3CC/25GX1") 25G X 1" 3 ML MISC Use with vit b12 injections, once weekly, to inject 1 mL intramuscularly. 09/03/22   Alfredia Ferguson, PA-C    Family  History Family History  Problem Relation Age of Onset   Cancer Mother        Breast Cancer age 19s   Breast cancer Mother    Cancer Maternal Grandmother        Cervical and Colon Cancer     Social History Social History   Tobacco Use   Smoking status: Never   Smokeless tobacco: Never  Vaping Use   Vaping status: Never Used  Substance Use Topics   Alcohol use: Yes    Comment: Occasional   Drug use: No     Allergies   Cephalosporins and Sulfa antibiotics   Review of Systems Review of Systems  Genitourinary:  Positive for dysuria and frequency.     Physical Exam Triage Vital Signs ED Triage Vitals  Encounter Vitals Group     BP 10/09/22 1505 121/70     Systolic BP Percentile --      Diastolic BP Percentile --      Pulse Rate 10/09/22 1505 98     Resp 10/09/22 1505 16     Temp 10/09/22 1505 98.8 F (37.1 C)     Temp Source 10/09/22 1505 Oral     SpO2 10/09/22 1505 98 %     Weight --      Height --      Head Circumference --      Peak Flow --      Pain Score 10/09/22 1507 2     Pain Loc --      Pain Education --      Exclude from Growth Chart --    No data found.  Updated Vital Signs BP 121/70 (BP Location: Left Arm)   Pulse 98   Temp 98.8 F (37.1 C) (Oral)   Resp 16   SpO2 98%   Visual Acuity Right Eye Distance:   Left Eye Distance:   Bilateral Distance:    Right Eye Near:   Left Eye Near:    Bilateral Near:     Physical Exam Vitals and nursing note reviewed.  Constitutional:      Appearance: Normal appearance.  HENT:     Head: Normocephalic and atraumatic.  Eyes:     Pupils: Pupils are equal, round, and reactive to light.  Cardiovascular:     Rate and Rhythm: Normal rate.  Pulmonary:     Effort: Pulmonary effort is normal.  Abdominal:     Tenderness: There is no right CVA tenderness or left CVA tenderness.  Skin:    General: Skin is warm and dry.  Neurological:     General: No focal deficit present.     Mental Status: She is  alert and oriented to person, place, and time.  Psychiatric:        Mood and Affect: Mood normal.        Behavior: Behavior normal.      UC Treatments / Results  Labs (all labs ordered are listed, but only abnormal results are displayed) Labs Reviewed  POCT URINALYSIS DIP (MANUAL ENTRY) - Abnormal; Notable for the following components:      Result Value   Color, UA orange (*)    Glucose, UA =100 (*)    Bilirubin, UA small (*)    Ketones, POC UA trace (5) (*)    Spec Grav, UA >=1.030 (*)    Protein Ur, POC trace (*)    Nitrite, UA Positive (*)    All other components within normal limits  URINE CULTURE   Comprehensive Metabolic Panel Order: [id number] Component Ref Range & Units 11 mo ago  Sodium 135 - 146 MMOL/L 140  Potassium 3.5 - 5.3 MMOL/L 4.1  Chloride 98 - 110 MMOL/L 107  CO2 23 - 30 MMOL/L 27  BUN 8 - 24 MG/DL 12  Glucose 70 - 99 MG/DL 87  Comment: Patients taking eltrombopag at doses >/= 100 mg daily may show falsely elevated values of 10% or greater.  Creatinine 0.50 - 1.50 MG/DL 4.09  Calcium 8.5 - 81.1 MG/DL 8.9  Total Protein 6.0 - 8.3 G/DL 6.8  Comment: Patients taking eltrombopag at doses >/= 100 mg daily may show falsely elevated values of 10% or greater.  Albumin 3.5 - 5.0 G/DL 4.0  Total Bilirubin 0.1 - 1.2 MG/DL 0.4  Comment: Patients taking eltrombopag at doses >/= 100 mg daily may show falsely elevated values of 10% or greater.  Alkaline Phosphatase 25 - 125 IU/L or U/L 100  AST (SGOT) 5 - 40 IU/L or U/L 21  ALT (SGPT) 5 - 50 IU/L or U/L 13  Anion Gap 4 - 14 MMOL/L 6  Est. GFR >=60 ML/MIN/1.73 M*2 81  Comment: GFR estimated by CKD-EPI equations(NKF 2021).  "Recommend confirmation of Cr-based eGFR by using Cys-based eGFR and other filtration markers (if applicable) in complex cases and clinical decision-making, as needed."  Resulting Agency [facility]    EKG   Radiology No results found.  Procedures Procedures  (including critical care time)  Medications Ordered in UC Medications - No data to display  Initial Impression / Assessment and Plan / UC Course  I have reviewed the triage vital signs and the nursing notes.  Pertinent labs & imaging results that were available during my care of the patient were reviewed by me and considered in my medical decision making (see chart for details).     Reviewed exam and symptoms with patient.  No red flags.  UA positive for UTI, patient to take Azo, will culture and start Macrobid based on symptoms.  PCP follow-up 2 days for recheck and ER precautions reviewed and patient verbalized understanding. Final Clinical Impressions(s) / UC Diagnoses   Final diagnoses:  Acute cystitis with hematuria     Discharge Instructions      Start Macrobid twice daily for 7 days.  The clinic will contact you with results of the urine culture done today if positive.  Lots of rest and fluids.  Please follow-up with your PCP in 2 days for recheck.  Please go to the ER for any worsening symptoms.  I hope you feel better soon!    ED Prescriptions     Medication Sig Dispense Auth. Provider   nitrofurantoin, macrocrystal-monohydrate, (MACROBID) 100 MG capsule Take 1 capsule (100 mg total) by mouth 2 (two) times daily for 7 days. 14 capsule Radford Pax, NP      PDMP not reviewed this encounter.   Radford Pax, NP 10/09/22 1536    Radford Pax, NP 10/09/22  1537  

## 2022-10-09 NOTE — ED Triage Notes (Signed)
Pt states urinary frequency for the past 2 days. States she has been taking Azo at home.

## 2022-10-09 NOTE — Discharge Instructions (Signed)
Start Macrobid twice daily for 7 days.  The clinic will contact you with results of the urine culture done today if positive.  Lots of rest and fluids.  Please follow-up with your PCP in 2 days for recheck.  Please go to the ER for any worsening symptoms.  I hope you feel better soon!

## 2022-10-10 LAB — URINE CULTURE: Culture: NO GROWTH

## 2022-10-19 ENCOUNTER — Encounter: Payer: Self-pay | Admitting: Urology

## 2022-10-26 ENCOUNTER — Ambulatory Visit (HOSPITAL_BASED_OUTPATIENT_CLINIC_OR_DEPARTMENT_OTHER): Payer: PRIVATE HEALTH INSURANCE

## 2022-10-26 ENCOUNTER — Ambulatory Visit (HOSPITAL_BASED_OUTPATIENT_CLINIC_OR_DEPARTMENT_OTHER): Payer: PRIVATE HEALTH INSURANCE | Admitting: Student

## 2022-10-26 ENCOUNTER — Encounter (HOSPITAL_BASED_OUTPATIENT_CLINIC_OR_DEPARTMENT_OTHER): Payer: Self-pay | Admitting: Student

## 2022-10-26 DIAGNOSIS — M25561 Pain in right knee: Secondary | ICD-10-CM

## 2022-10-26 DIAGNOSIS — M1711 Unilateral primary osteoarthritis, right knee: Secondary | ICD-10-CM

## 2022-10-26 MED ORDER — LIDOCAINE HCL 1 % IJ SOLN
4.0000 mL | INTRAMUSCULAR | Status: AC | PRN
Start: 2022-10-26 — End: 2022-10-26
  Administered 2022-10-26: 4 mL

## 2022-10-26 MED ORDER — TRIAMCINOLONE ACETONIDE 40 MG/ML IJ SUSP
2.0000 mL | INTRAMUSCULAR | Status: AC | PRN
Start: 2022-10-26 — End: 2022-10-26
  Administered 2022-10-26: 2 mL via INTRA_ARTICULAR

## 2022-10-26 NOTE — Progress Notes (Signed)
Chief Complaint: Right knee pain     History of Present Illness:    Claire Goodwin is a 57 y.o. female presenting today with right knee pain.  Patient had a fall 2 days ago while helping carry a picnic table.  She was walking backwards and tripped on the concrete, falling backwards.  Today she reports pain in the front of the knee and rates it at a 7/10.  She has a lot of difficulty weightbearing.  She is having trouble bending the knee.  Has been resting, elevating, and has tried taking Advil and a muscle relaxer.  Denies any previous knee injuries or issues.  Denies any numbness or tingling.   Surgical History:   None  PMH/PSH/Family History/Social History/Meds/Allergies:    Past Medical History:  Diagnosis Date   Anxiety    Depression    GERD (gastroesophageal reflux disease)    History of migraine headaches    Hypertension    Strep throat    Past Surgical History:  Procedure Laterality Date   CESAREAN SECTION     CHOLECYSTECTOMY N/A 06/10/2018   Procedure: LAPAROSCOPIC CHOLECYSTECTOMY WITH INTRAOPERATIVE CHOLANGIOGRAM;  Surgeon: Ovidio Kin, MD;  Location: [facility];  Service: General;  Laterality: N/A;   ERCP N/A 06/11/2018   Procedure: ENDOSCOPIC RETROGRADE CHOLANGIOPANCREATOGRAPHY (ERCP);  Surgeon: Willis Modena, MD;  Location: [facility];  Service: Endoscopy;  Laterality: N/A;   REMOVAL OF STONES  06/11/2018   Procedure: REMOVAL OF STONES;  Surgeon: Willis Modena, MD;  Location: [facility];  Service: Endoscopy;;   SPHINCTEROTOMY  06/11/2018   Procedure: Dennison Mascot;  Surgeon: Willis Modena, MD;  Location: [facility];  Service: Endoscopy;;   TONSILLECTOMY     UMBILICAL HERNIA REPAIR  06/10/2018   Procedure: HERNIA REPAIR UMBILICAL ADULT;  Surgeon: Ovidio Kin, MD;  Location: [facility];  Service: General;;   Social History   Socioeconomic History   Marital status: Married    Spouse name: Not on file   Number of children:  Not on file   Years of education: Not on file   Highest education level: Not on file  Occupational History   Not on file  Tobacco Use   Smoking status: Never   Smokeless tobacco: Never  Vaping Use   Vaping status: Never Used  Substance and Sexual Activity   Alcohol use: Yes    Comment: Occasional   Drug use: No   Sexual activity: Yes  Other Topics Concern   Not on file  Social History Narrative   Marital Status:  Married ([place])   Children:  [id number]  Thurston Hole, Margaretmary Bayley)   Pets:  None    Living Situation: Lives with spouse and children   Occupation:  Pensions consultant    Education: Engineer, maintenance (IT) [employer] ([employer])    Tobacco Use/Exposure:  None    Alcohol Use:  Occasional   Drug Use:  None   Diet:  Regular   Exercise: Treadmill (3-5 x per week)    Hobbies: Reading    Social Determinants of Health   Financial Resource Strain: Not on file  Food Insecurity: Not on file  Transportation Needs: Not on file  Physical Activity: Not on file  Stress: Not on file  Social Connections: Unknown (12/04/2021)   [facility] from [facility]   Social Network  Social Network: Not on file   Family History  Problem Relation Age of Onset   Cancer Mother        Breast Cancer age 39s   Breast cancer Mother    Cancer Maternal Grandmother        Cervical and Colon Cancer    Allergies  Allergen Reactions   Cephalosporins Rash   Sulfa Antibiotics Rash   Current Outpatient Medications  Medication Sig Dispense Refill   buPROPion (WELLBUTRIN XL) 300 MG 24 hr tablet Take 1 tablet (300 mg total) by mouth daily. 90 tablet 3   cyanocobalamin (VITAMIN B12) 1000 MCG/ML injection Inject 1 mL (1,000 mcg total) into the muscle every 30 (thirty) days. 10 mL 1   cyclobenzaprine (FLEXERIL) 5 MG tablet Take by mouth.     diphenhydrAMINE (BENADRYL) 25 mg capsule Take by mouth.     escitalopram (LEXAPRO) 20 MG tablet Take 1 tablet (20 mg total) by mouth daily. 90 tablet 1   estradiol  (ESTRACE) 0.1 MG/GM vaginal cream Insert 1 gram in vagina every Monday and Thursday for vaginal dryness 42.5 g 1   fluticasone (FLONASE) 50 MCG/ACT nasal spray Place 2 sprays into both nostrils daily. 17 g 11   ibuprofen (ADVIL) 800 MG tablet Take by mouth.     Insulin Syringe-Needle U-100 (GLOBAL INJECT EASE INSULIN SYR) 30G X 1/2" 0.5 ML MISC Use 1 syringe every 14 days     Multiple Vitamin (MULTIVITAMIN) tablet Take 1 tablet by mouth daily.     nadolol (CORGARD) 20 MG tablet Take 5 mg by mouth daily. 1/4 tablet daily     Syringe/Needle, Disp, (SYRINGE 3CC/25GX1") 25G X 1" 3 ML MISC Use with vit b12 injections, once weekly, to inject 1 mL intramuscularly. 10 each 1   No current facility-administered medications for this visit.   No results found.  Review of Systems:   A ROS was performed including pertinent positives and negatives as documented in the HPI.  Physical Exam :   Constitutional: NAD and appears stated age Neurological: Alert and oriented Psych: Appropriate affect and cooperative There were no vitals taken for this visit.   Comprehensive Musculoskeletal Exam:    No medial or lateral joint line tenderness of the right knee.  Active range of motion limited from 0 to 30 degrees due to pain.  Mild effusion present.  No erythema or warmth.  No laxity with varus or valgus stress.  Negative Lachman.  Imaging:   Xray (right knee 4 views): Mild medial joint space narrowing.  Moderate patellofemoral osteoarthritis with diffuse osteophytes.  Negative for acute bony abnormality.   I personally reviewed and interpreted the radiographs.   Assessment:   57 y.o. female with acute right knee pain after a fall 2 days ago.  She patient does not recall a twisting injury or direct impact to the knee.  She does have some osteoarthritis of the knee, most notable in the patellofemoral compartment.  I do suspect that the fall aggravated this existing arthritis with little suspicion for  ligamentous or meniscal injury.  I believe she would benefit significantly from a cortisone injection and patient is agreeable to this today.  Cortisone was injected using the superolateral approach and she tolerated this well.  Will plan to see him back as needed.  Plan :    -Cortisone injection performed of the right knee today after patient consent was -Return to clinic as needed    Procedure Note  Patient: Claire Goodwin  Date of Birth: [birth year]           MRN: [id number]             Visit Date: 10/26/2022  Procedures: Visit Diagnoses:  1. Unilateral primary osteoarthritis, right knee     Large Joint Inj: R knee on 10/26/2022 9:53 PM Indications: pain Details: 22 G 1.5 in needle, superolateral approach Medications: 4 mL lidocaine 1 %; 2 mL triamcinolone acetonide 40 MG/ML Outcome: tolerated well, no immediate complications Procedure, treatment alternatives, risks and benefits explained, specific risks discussed. Consent was given by the patient. Immediately prior to procedure a time out was called to verify the correct patient, procedure, equipment, support staff and site/side marked as required. Patient was prepped and draped in the usual sterile fashion.       I personally saw and evaluated the patient, and participated in the management and treatment plan.  Hazle Nordmann, PA-C Orthopedics

## 2022-11-02 ENCOUNTER — Telehealth: Payer: 59 | Admitting: Family Medicine

## 2022-11-02 DIAGNOSIS — N393 Stress incontinence (female) (male): Secondary | ICD-10-CM

## 2022-11-02 NOTE — Progress Notes (Signed)
[facility] Patient Progress Note    Date of Encounter: 11/02/22     Chief Complaint:   Chief Complaint   Patient presents with    Procedure Follow-up     Bulkamid        History of Present Illness:   Michele Sims is a 57 year old female with a history of right breast cancer and urinary incontinence presenting today for follow up.     She underwent transurethral bulking with Dr. Maurice March on 10/01/22 with Bulkamid and presents today via telemedicine to discuss.     She feels overall her leakage has improved significantly. She has had many days where her pad is completely dry. She does still occasionally have very small amounts of leakage. She has been having a strange sensation where she feels she is leaking urine but will go to the restroom and she will not have leaked. She denies this as a sensation of urgency. Denies hesitancy, dysuria, hematuria or urgency. Denies urinary frequency. Feels she is emptying her bladder to completion.     Review of Systems:  A 14-point review of systems was performed and is described as above. Detailed ROS was negative unless otherwise noted above.    Past Medical History:  Past Medical History:   Diagnosis Date    Acute gastric ulcer without hemorrhage or perforation     Acute nasopharyngitis     Anemia     Asthma     Hiatal hernia     History of adenomatous polyp of colon     History of malignant neoplasm of right breast      Past Surgical History:  Past Surgical History:   Procedure Laterality Date    CESAREAN SECTION, UNKNOWN SCAR      CHOLECYSTECTOMY      Endoscopic Sinus Surgery      Right Breast Operation      TONSILLECTOMY       Family History:  Family History   Problem Relation Name Age of Onset    Prostate Cancer Father      Arthritis Sister      Prostate Cancer M Grandfather         Social History:  Socioeconomic History    Marital status: Divorced   Tobacco Use    Smoking status: Never        CURRENT MEDS:  Current Outpatient Medications:     diazePAM (VALIUM)  5 MG tablet, , Disp: , Rfl:     estradiol (ESTRACE) 0.1 MG/GM vaginal cream, INSERT 1 GRAM VAGINALLY DAILY. FOLLOW PACKAGE DIRECTIONS, Disp: 42.5 g, Rfl: 4    hydrocortisone 2.5 % cream, Apply topically., Disp: , Rfl:     mometasone (NASONEX) 50 MCG/ACT nasal spray, by Nasal route., Disp: , Rfl:     phenazopyridine (PYRIDIUM) 200 MG tablet, Take 1 tablet (200 mg) by mouth 3 times daily., Disp: 3 tablet, Rfl: 0    ALLERGIES:   Erythromycin and Erythromycin base    EXAM:  No physical exam as this is a telemedicine visit.     Assessment and Plan: Michele Sims is a 57 year old female with a history of right breast cancer presenting today with    ICD-10-CM ICD-9-CM   1. Stress incontinence  N39.3 [id number]   SUI improved significantly after bulkamid injections however patient has not been dancing yet which is when she has her most significant leakage. Also having sensation of leakage without actual leakage.     Patient will follow up  in one month via mychart with updates after dancing.     This telemedicine visit was conducted Audio Only. The patient was in [place] for the duration of the visit.  The patient consented to a virtual visit, understanding the risks, such as a limited examination, and was aware that an in-person visit was available to them. Total time spent was 5-10 minutes.     Lamona Curl) Lani Mendiola, PA-C  Department of Obstetrics and Gynecology     Supervising MD: Dr. Maurice March

## 2022-12-14 ENCOUNTER — Other Ambulatory Visit: Payer: Self-pay | Admitting: Physician Assistant

## 2022-12-14 DIAGNOSIS — N951 Menopausal and female climacteric states: Secondary | ICD-10-CM

## 2023-01-04 ENCOUNTER — Ambulatory Visit: Payer: PRIVATE HEALTH INSURANCE | Admitting: Physician Assistant

## 2023-01-04 VITALS — BP 145/77 | HR 73 | Temp 98.5°F | Ht <= 58 in | Wt 114.1 lb

## 2023-01-04 DIAGNOSIS — N950 Postmenopausal bleeding: Secondary | ICD-10-CM

## 2023-01-04 DIAGNOSIS — N939 Abnormal uterine and vaginal bleeding, unspecified: Secondary | ICD-10-CM

## 2023-01-04 NOTE — Progress Notes (Signed)
Established patient visit   Patient: Claire Goodwin   DOB: [birth year]   57 y.o. Female  MRN: [id number] Visit Date: 01/04/2023  Today's healthcare provider: Alfredia Ferguson, PA-C   Cc. Vaginal bleeding  Subjective    Pt reports Saturday with sudden onset significant vaginal bleeding. Enough to wear a pad, like a period, with some small clots. Some slight lower abdominal cramping. Yesterday and today more like spotting. Reports her last menstrual cycle was 2-3 years ago. Denies urinary frequency, dysuria. Denies abdominal pain, change in BM. Denies vaginal discharge, itching.  Medications: Outpatient Medications Prior to Visit  Medication Sig   buPROPion (WELLBUTRIN XL) 300 MG 24 hr tablet Take 1 tablet (300 mg total) by mouth daily.   cyanocobalamin (VITAMIN B12) 1000 MCG/ML injection Inject 1 mL (1,000 mcg total) into the muscle every 30 (thirty) days.   cyclobenzaprine (FLEXERIL) 5 MG tablet Take by mouth.   diphenhydrAMINE (BENADRYL) 25 mg capsule Take by mouth.   escitalopram (LEXAPRO) 20 MG tablet Take 1 tablet (20 mg total) by mouth daily.   estradiol (ESTRACE) 0.1 MG/GM vaginal cream Place 1 g vaginally 2 (two) times a week.   ibuprofen (ADVIL) 800 MG tablet Take by mouth.   Insulin Syringe-Needle U-100 (GLOBAL INJECT EASE INSULIN SYR) 30G X 1/2" 0.5 ML MISC Use 1 syringe every 14 days   Multiple Vitamin (MULTIVITAMIN) tablet Take 1 tablet by mouth daily.   nadolol (CORGARD) 20 MG tablet Take 5 mg by mouth daily. 1/4 tablet daily   Syringe/Needle, Disp, (SYRINGE 3CC/25GX1") 25G X 1" 3 ML MISC Use with vit b12 injections, once weekly, to inject 1 mL intramuscularly.   fluticasone (FLONASE) 50 MCG/ACT nasal spray Place 2 sprays into both nostrils daily.   No facility-administered medications prior to visit.    Review of Systems  Constitutional:  Negative for fatigue and fever.  Respiratory:  Negative for cough and shortness of breath.   Cardiovascular:  Negative for  chest pain and leg swelling.  Gastrointestinal:  Negative for abdominal pain.  Genitourinary:  Positive for vaginal bleeding.  Neurological:  Negative for dizziness and headaches.       Objective    BP (!) 145/77   Pulse 73   Temp 98.5 F (36.9 C) (Oral)   Ht 4\' 10"  (1.473 m)   Wt 114 lb 2 oz (51.8 kg)   SpO2 97%   BMI 23.85 kg/m    Physical Exam Vitals reviewed.  Constitutional:      Appearance: She is not ill-appearing.  HENT:     Head: Normocephalic.  Eyes:     Conjunctiva/sclera: Conjunctivae normal.  Cardiovascular:     Rate and Rhythm: Normal rate.  Pulmonary:     Effort: Pulmonary effort is normal. No respiratory distress.  Neurological:     General: No focal deficit present.     Mental Status: She is alert and oriented to person, place, and time.  Psychiatric:        Mood and Affect: Mood normal.        Behavior: Behavior normal.      No results found for any visits on 01/04/23.  Assessment & Plan    Post-menopausal bleeding -     US PELVIC COMPLETE WITH TRANSVAGINAL -     Ambulatory referral to Gynecology  Abnormal uterine bleeding -     US PELVIC COMPLETE WITH TRANSVAGINAL -     Ambulatory referral to Gynecology   Recommending pelvic ultrasound, referral  to gyn. Will f/u with ultrasound results.  Return if symptoms worsen or fail to improve.      Alfredia Ferguson, PA-C  [facility] [phone] (phone) [phone] (fax)  [facility]

## 2023-01-05 ENCOUNTER — Ambulatory Visit (HOSPITAL_BASED_OUTPATIENT_CLINIC_OR_DEPARTMENT_OTHER)
Admission: RE | Admit: 2023-01-05 | Discharge: 2023-01-05 | Disposition: A | Discharge status: Home / Self Care | Payer: PRIVATE HEALTH INSURANCE | Source: Ambulatory Visit | Attending: Physician Assistant | Admitting: Physician Assistant

## 2023-01-05 DIAGNOSIS — N950 Postmenopausal bleeding: Secondary | ICD-10-CM | POA: Diagnosis present

## 2023-01-05 DIAGNOSIS — N939 Abnormal uterine and vaginal bleeding, unspecified: Secondary | ICD-10-CM | POA: Insufficient documentation

## 2023-03-24 ENCOUNTER — Other Ambulatory Visit: Payer: Self-pay | Admitting: Physician Assistant

## 2023-03-24 DIAGNOSIS — F411 Generalized anxiety disorder: Secondary | ICD-10-CM

## 2023-04-02 ENCOUNTER — Other Ambulatory Visit (HOSPITAL_COMMUNITY)
Admission: RE | Admit: 2023-04-02 | Discharge: 2023-04-02 | Disposition: A | Discharge status: Home / Self Care | Payer: PRIVATE HEALTH INSURANCE | Source: Ambulatory Visit | Attending: Obstetrics and Gynecology | Admitting: Obstetrics and Gynecology

## 2023-04-02 ENCOUNTER — Ambulatory Visit: Payer: PRIVATE HEALTH INSURANCE | Admitting: Obstetrics and Gynecology

## 2023-04-02 VITALS — BP 118/69 | HR 76 | Ht <= 58 in | Wt 112.0 lb

## 2023-04-02 DIAGNOSIS — N958 Other specified menopausal and perimenopausal disorders: Secondary | ICD-10-CM | POA: Diagnosis not present

## 2023-04-02 DIAGNOSIS — N950 Postmenopausal bleeding: Secondary | ICD-10-CM

## 2023-04-02 DIAGNOSIS — Z0000 Encounter for general adult medical examination without abnormal findings: Secondary | ICD-10-CM

## 2023-04-02 DIAGNOSIS — N941 Unspecified dyspareunia: Secondary | ICD-10-CM | POA: Diagnosis not present

## 2023-04-02 MED ORDER — ESTRADIOL 10 MCG VA TABS
1.0000 | ORAL_TABLET | VAGINAL | 12 refills | Status: AC
Start: 2023-04-05 — End: ?

## 2023-04-02 NOTE — Progress Notes (Signed)
 NEW GYNECOLOGY PATIENT Patient name: Claire Goodwin MRN [id number]  Date of birth: 1965-10-17 Chief Complaint:   Gynecologic Exam (Post menopausal bleeding in Dec.  None since then.  Recently had a cyst rupture on R labia that drained yellow/white drainage. Painful intercourse.)     History:  Claire Goodwin is a 58 y.o. No obstetric history on file. being seen today for PMB.  Discussed the use of AI scribe software for clinical note transcription with the patient, who gave verbal consent to proceed.  History of Present Illness   Claire Goodwin is a 58 year old female who presents with postmenopausal bleeding and dyspareunia.  She experienced a single episode of postmenopausal bleeding that lasted about a week, followed by some spotting. This episode occurred prior to today's visit and has not recurred. No pain was associated with the bleeding. An ultrasound was previously performed, which showed a reassuring thin endometrial stripe. She recalls having an endometrial biopsy in June 2022 due to continued menstruation at that time.  She reports significant pain during intercourse, described as a 'friction pain' that is not alleviated by lubricants or estradiol cream. She used estradiol cream for about a year but discontinued it before the bleeding episode due to its ineffectiveness and messiness. The pain has impacted her enjoyment of intercourse, which she previously enjoyed.   She mentions a history of vulvar cysts, which have occasionally been painful and drained spontaneously. Her daughter, aged 29, also has a history of cysts, suggesting a possible hereditary component.  Her last Pap smear was a few years ago and was normal. She is unsure of the exact date but believes it was within the last five years.            Gynecologic History No LMP recorded. Patient is perimenopausal. Contraception: post menopausal status Last Pap: 09/2016 NILM Last Mammogram:  unknown how many years ago  completed Last Colonoscopy:  2016  Obstetric History OB History  No obstetric history on file.    Past Medical History:  Diagnosis Date   Anxiety    Depression    GERD (gastroesophageal reflux disease)    History of migraine headaches    Hypertension    Strep throat     Past Surgical History:  Procedure Laterality Date   CESAREAN SECTION     CHOLECYSTECTOMY N/A 06/10/2018   Procedure: LAPAROSCOPIC CHOLECYSTECTOMY WITH INTRAOPERATIVE CHOLANGIOGRAM;  Surgeon: Ovidio Kin, MD;  Location: [facility];  Service: General;  Laterality: N/A;   ERCP N/A 06/11/2018   Procedure: ENDOSCOPIC RETROGRADE CHOLANGIOPANCREATOGRAPHY (ERCP);  Surgeon: Willis Modena, MD;  Location: [facility];  Service: Endoscopy;  Laterality: N/A;   REMOVAL OF STONES  06/11/2018   Procedure: REMOVAL OF STONES;  Surgeon: Willis Modena, MD;  Location: [facility];  Service: Endoscopy;;   SPHINCTEROTOMY  06/11/2018   Procedure: Dennison Mascot;  Surgeon: Willis Modena, MD;  Location: [facility];  Service: Endoscopy;;   TONSILLECTOMY     UMBILICAL HERNIA REPAIR  06/10/2018   Procedure: HERNIA REPAIR UMBILICAL ADULT;  Surgeon: Ovidio Kin, MD;  Location: [facility];  Service: General;;    Current Outpatient Medications on File Prior to Visit  Medication Sig Dispense Refill   buPROPion (WELLBUTRIN XL) 300 MG 24 hr tablet Take 1 tablet (300 mg total) by mouth daily. 90 tablet 3   cyanocobalamin (VITAMIN B12) 1000 MCG/ML injection Inject 1 mL (1,000 mcg total) into the muscle every 30 (thirty) days. 10 mL 1   cyclobenzaprine (FLEXERIL) 5 MG  tablet Take by mouth.     diphenhydrAMINE (BENADRYL) 25 mg capsule Take by mouth.     escitalopram (LEXAPRO) 20 MG tablet Take 1 tablet (20 mg total) by mouth daily. 90 tablet 1   estradiol (ESTRACE) 0.1 MG/GM vaginal cream Place 1 g vaginally 2 (two) times a week. 42.5 g 5   fluticasone (FLONASE) 50 MCG/ACT nasal spray Place 2 sprays into both nostrils daily. 17 g 11   ibuprofen  (ADVIL) 800 MG tablet Take by mouth.     Insulin Syringe-Needle U-100 (GLOBAL INJECT EASE INSULIN SYR) 30G X 1/2" 0.5 ML MISC Use 1 syringe every 14 days     Multiple Vitamin (MULTIVITAMIN) tablet Take 1 tablet by mouth daily.     nadolol (CORGARD) 20 MG tablet Take 5 mg by mouth daily. 1/4 tablet daily     Syringe/Needle, Disp, (SYRINGE 3CC/25GX1") 25G X 1" 3 ML MISC Use with vit b12 injections, once weekly, to inject 1 mL intramuscularly. 10 each 1   No current facility-administered medications on file prior to visit.    Allergies  Allergen Reactions   Cephalosporins Rash   Sulfa Antibiotics Rash    Social History:  reports that she has never smoked. She has never used smokeless tobacco. She reports current alcohol use. She reports that she does not use drugs.  Family History  Problem Relation Age of Onset   Cancer Mother        Breast Cancer age 72s   Breast cancer Mother    Cancer Maternal Grandmother        Cervical and Colon Cancer     The following portions of the patient's history were reviewed and updated as appropriate: allergies, current medications, past family history, past medical history, past social history, past surgical history and problem list.  Review of Systems Pertinent items noted in HPI and remainder of comprehensive ROS otherwise negative.  Physical Exam:  BP 118/69   Pulse 76   Ht 4\' 10"  (1.473 m)   Wt 112 lb (50.8 kg)   BMI 23.41 kg/m  Physical Exam Vitals and nursing note reviewed. Exam conducted with a chaperone present.  Constitutional:      Appearance: Normal appearance.  Cardiovascular:     Rate and Rhythm: Normal rate.  Pulmonary:     Effort: Pulmonary effort is normal.     Breath sounds: Normal breath sounds.  Genitourinary:    General: Normal vulva.       Comments: Notes cyst was in right inner labial fold, no lesion noted or tenderness Atrophic vaginal introitus, urethral meatus and cervix Tender levator ani bilaterally Tender  obturator internus bilaterally  Neurological:     General: No focal deficit present.     Mental Status: She is alert and oriented to person, place, and time.  Psychiatric:        Mood and Affect: Mood normal.        Behavior: Behavior normal.        Thought Content: Thought content normal.        Judgment: Judgment normal.     PROCEDURE: US PELVIS COMPLETE WITH TRANSVAGINAL   HISTORY: Patient is a 58 y/o  F with post menopasual bleeding.   COMPARISON: None.   TECHNIQUE: Two-dimensional transabdominal grayscale and color Doppler ultrasound of the pelvis was performed. Transvaginal was performed.   FINDINGS: The uterus is anteverted in position and measures 5.8 x 2.5 x 3.2 cm. It demonstrates a heterogeneous echotexture. The endometrium measures 0.2 cm  and demonstrates a normal homogeneous echotexture. Nabothian cysts are visualized within the cervix.   The right ovary is not visualized.   The left ovary measures 1.2 x 0.6 x 0.8 cm and demonstrates a normal echotexture. Color Doppler evaluation is nondiagnostic.   There is no fluid present within the cul-de-sac.   IMPRESSION: 1. Heterogeneous uterine echotexture. No sonographic evidence of fibroid.   2.  Nonvisualization of the right ovary.   Thank you for allowing [place] to assist in the care of this patient.     Electronically Signed   By: Lestine Box M.D.   On: 01/09/2023 09:   Endometrial Biopsy Procedure  Patient identified, informed consent performed,  indication reviewed, consent signed.  Reviewed risk of perforation, pain, bleeding, insufficient sample, etc were reviewd. Time out was performed.  Urine pregnancy test negative.  Speculum placed in the vagina.  Cervix visualized.  Cleaned with Betadine x 2.    Paracervical block was administered.  Endometrial pipelle was used to draw up 1cc of 1% lidocaine, introduced into the cervical os and instilled into the endometrial cavity.  The pipelle was passed twice  without difficulty and sample obtained. Tenaculum was removed, good hemostasis noted.  Patient tolerated procedure well.  Patient was given post-procedure instructions.    Assessment and Plan:   Assessment and Plan    Postmenopausal bleeding Single episode of postmenopausal bleeding with subsequent spotting. Concern for precancerous or cancerous conditions despite reassuring ultrasound. - Now s/p uncomplicated endometrial biopsy to rule out precancerous or cancerous conditions.  Dyspareunia Postmenopausal dyspareunia not relieved by lubricants or estradiol cream. Pelvic floor muscle pain suspected. - Recommend pelvic floor physical therapy to address pelvic floor muscle pain. - Discuss alternative formulations of vaginal estrogen that may be less messy and more effective.  Vulvar cysts Vulvar cysts resolved but history of pain and family history noted. - Monitor for recurrence   Follow-up Mammogram overdue. Awaiting endometrial biopsy results. - Schedule mammogram. - Review endometrial biopsy results when available.      Routine preventative health maintenance measures emphasized. Please refer to After Visit Summary for other counseling recommendations.   Follow-up: No follow-ups on file.      Claire Shire, MD Obstetrician & Gynecologist, [facility] [employer], [facility]

## 2023-04-05 ENCOUNTER — Encounter: Payer: Self-pay | Admitting: Obstetrics and Gynecology

## 2023-04-05 LAB — SURGICAL PATHOLOGY

## 2023-04-06 ENCOUNTER — Encounter (HOSPITAL_BASED_OUTPATIENT_CLINIC_OR_DEPARTMENT_OTHER): Payer: Self-pay

## 2023-04-06 ENCOUNTER — Ambulatory Visit (HOSPITAL_BASED_OUTPATIENT_CLINIC_OR_DEPARTMENT_OTHER)
Admission: RE | Admit: 2023-04-06 | Discharge: 2023-04-06 | Disposition: A | Discharge status: Home / Self Care | Payer: PRIVATE HEALTH INSURANCE | Source: Ambulatory Visit | Attending: Physician Assistant | Admitting: Physician Assistant

## 2023-04-06 DIAGNOSIS — Z1231 Encounter for screening mammogram for malignant neoplasm of breast: Secondary | ICD-10-CM | POA: Insufficient documentation

## 2023-04-06 LAB — CYTOLOGY - PAP
Chlamydia: NEGATIVE
Comment: NEGATIVE
Comment: NEGATIVE
Comment: NORMAL
Diagnosis: NEGATIVE
High risk HPV: NEGATIVE
Neisseria Gonorrhea: NEGATIVE

## 2023-04-08 ENCOUNTER — Encounter: Payer: Self-pay | Admitting: Physician Assistant

## 2023-04-09 ENCOUNTER — Other Ambulatory Visit: Payer: Self-pay | Admitting: Physician Assistant

## 2023-04-09 DIAGNOSIS — R928 Other abnormal and inconclusive findings on diagnostic imaging of breast: Secondary | ICD-10-CM

## 2023-04-16 ENCOUNTER — Ambulatory Visit
Admission: RE | Admit: 2023-04-16 | Discharge: 2023-04-16 | Disposition: A | Discharge status: Home / Self Care | Payer: PRIVATE HEALTH INSURANCE | Source: Ambulatory Visit | Attending: Physician Assistant | Admitting: Physician Assistant

## 2023-04-16 DIAGNOSIS — R928 Other abnormal and inconclusive findings on diagnostic imaging of breast: Secondary | ICD-10-CM

## 2023-04-19 ENCOUNTER — Encounter: Payer: Self-pay | Admitting: Physician Assistant

## 2023-06-01 ENCOUNTER — Other Ambulatory Visit: Payer: Self-pay

## 2023-06-01 ENCOUNTER — Ambulatory Visit: Payer: PRIVATE HEALTH INSURANCE | Attending: Obstetrics and Gynecology

## 2023-06-01 DIAGNOSIS — M62838 Other muscle spasm: Secondary | ICD-10-CM | POA: Diagnosis present

## 2023-06-01 DIAGNOSIS — R293 Abnormal posture: Secondary | ICD-10-CM | POA: Insufficient documentation

## 2023-06-01 DIAGNOSIS — R279 Unspecified lack of coordination: Secondary | ICD-10-CM | POA: Diagnosis present

## 2023-06-01 DIAGNOSIS — N941 Unspecified dyspareunia: Secondary | ICD-10-CM | POA: Diagnosis not present

## 2023-06-01 DIAGNOSIS — M6281 Muscle weakness (generalized): Secondary | ICD-10-CM | POA: Diagnosis present

## 2023-06-01 DIAGNOSIS — N958 Other specified menopausal and perimenopausal disorders: Secondary | ICD-10-CM | POA: Insufficient documentation

## 2023-06-01 NOTE — Therapy (Signed)
 OUTPATIENT PHYSICAL THERAPY FEMALE PELVIC EVALUATION   Patient Name: Claire Goodwin MRN: [id number] DOB:Aug 07, 1965, 58 y.o., female Today's Date: 06/01/2023  END OF SESSION:  PT End of Session - 06/01/23 0807     Visit Number 1    Date for PT Re-Evaluation 11/16/23    Authorization Type [employer]    PT Start Time 0800    PT Stop Time 0840    PT Time Calculation (min) 40 min    Activity Tolerance Patient tolerated treatment well    Behavior During Therapy WFL for tasks assessed/performed             Past Medical History:  Diagnosis Date   Anxiety    Depression    GERD (gastroesophageal reflux disease)    History of migraine headaches    Hypertension    Strep throat    Past Surgical History:  Procedure Laterality Date   CESAREAN SECTION     CHOLECYSTECTOMY N/A 06/10/2018   Procedure: LAPAROSCOPIC CHOLECYSTECTOMY WITH INTRAOPERATIVE CHOLANGIOGRAM;  Surgeon: Juanita Norlander, MD;  Location: [facility];  Service: General;  Laterality: N/A;   ERCP N/A 06/11/2018   Procedure: ENDOSCOPIC RETROGRADE CHOLANGIOPANCREATOGRAPHY (ERCP);  Surgeon: Evangeline Hilts, MD;  Location: [facility];  Service: Endoscopy;  Laterality: N/A;   REMOVAL OF STONES  06/11/2018   Procedure: REMOVAL OF STONES;  Surgeon: Evangeline Hilts, MD;  Location: [facility];  Service: Endoscopy;;   SPHINCTEROTOMY  06/11/2018   Procedure: [address];  Surgeon: Evangeline Hilts, MD;  Location: [facility];  Service: Endoscopy;;   TONSILLECTOMY     UMBILICAL HERNIA REPAIR  06/10/2018   Procedure: HERNIA REPAIR UMBILICAL ADULT;  Surgeon: Juanita Norlander, MD;  Location: [facility];  Service: General;;   Patient Active Problem List   Diagnosis Date Noted   Menopausal vaginal dryness 09/03/2022   Vitamin B12 deficiency 09/03/2022   Vitamin D deficiency 09/03/2022   Cholelithiasis with chronic cholecystitis 06/09/2018   Biliary colic 06/09/2018   GAD (generalized anxiety disorder) 11/26/2012   Depression 11/26/2012    Migraine with aura 11/26/2012   Allergic rhinitis 11/26/2012   GERD (gastroesophageal reflux disease) 11/26/2012   Acne 11/26/2012    PCP: Trenton Frock, PA-C  REFERRING PROVIDER: Kiki Pelton, MD   REFERRING DIAG: N94.10 (ICD-10-CM) - Dyspareunia in female N95.8 (ICD-10-CM) - Genitourinary syndrome of menopause  THERAPY DIAG:  Muscle weakness (generalized)  Other muscle spasm  Unspecified lack of coordination  Abnormal posture  Rationale for Evaluation and Treatment: Rehabilitation  ONSET DATE: 4 years ago  SUBJECTIVE:  SUBJECTIVE STATEMENT: Pt states that after menopause 4 years ago she has gradually started having more pain with intercourse. She states that husband cannot reach full penetration. She is inserting tablets vaginally of estradiol .    PAIN:  Are you having pain? Yes NPRS scale: 8/10 at worst Pain location: Vaginal  Pain type: tight Pain description: intermittent   Aggravating factors: intercourse/vaginal penetration Relieving factors: no vaginal penetration, using estrogen  PRECAUTIONS: None  RED FLAGS: None   WEIGHT BEARING RESTRICTIONS: No  FALLS:  Has patient fallen in last 6 months? 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   PLOF: Independent  PATIENT GOALS: pain free intercourse with husband  PERTINENT HISTORY:  Umbilical hernia repair, cholecystectomy, c-section, 2 vaginal deliveries  BOWEL MOVEMENT: Pain with bowel movement: No Type of bowel movement:Frequency 1x/day usually and Strain occasional straining Fully empty rectum: No Leakage: No Pads: No Fiber supplement/laxative No  URINATION: Pain with urination: No Fully empty bladder: Nowill have post-void dribbling Stream: sometimes starts and stops - goes to  the side  Urgency: No - but mild sensation that she has to go all the time Frequency: every 3 hours during the day; couple of times at night Leakage: Coughing and Sneezing Pads: No  INTERCOURSE:  Ability to have vaginal penetration Yes  Pain with intercourse: Initial Penetration, During Penetration, and Deep Penetration - mostly with deeper penetration Dryness: not nearly as dry now Climax: as not happened for a little while  Marinoff Scale: 1/3 Lubricant: yes - astroglide   PREGNANCY: Vaginal deliveries 2 Tearing Yes:   Episiotomy Yes  C-section deliveries 1 Currently pregnant No  PROLAPSE: Maybe - she is not quite sure   OBJECTIVE:  Note: Objective measures were completed at Evaluation unless otherwise noted.  06/01/23:  PATIENT SURVEYS:   PFIQ-7: 48  COGNITION: Overall cognitive status: Within functional limits for tasks assessed     SENSATION: Light touch: Appears intact   FUNCTIONAL TESTS:  Squat: unstable Single leg stance:  Rt: pelvic drop, unstable  Lt: pelvic drop, unstable Curl-up test: abdominal distortion throughout midline   GAIT: Assistive device utilized: None Comments: WNL  POSTURE: rounded shoulders, forward head, increased lumbar lordosis, increased thoracic kyphosis, and anterior pelvic tilt   LUMBARAROM/PROM:  A/PROM A/PROM  Eval (% available)  Flexion 100  Extension 25  Right lateral flexion WNL  Left lateral flexion WNL  Right rotation 50  Left rotation 50   (Blank rows = not tested)   PALPATION:   General: WNL  Pelvic Alignment: WNL  Abdominal: very tight and tender, decreased rib cage mobility, apical breathing pattern                External Perineal Exam: dry, decreased tissue tone, areas of redness at posterior fourchette and urethra                             Internal Pelvic Floor: very tender; burning around introitus, more notable on Rt  Patient confirms identification and approves PT to assess internal  pelvic floor and treatment Yes  PELVIC MMT:   MMT eval  Vaginal 1/5, 3 second hold, 6 repeat contractions in 10 seconds  Diastasis Recti WNL, but distortion throughout midline  (Blank rows = not tested)        TONE: high  PROLAPSE: Grade 1 anterior vaginal wall laxiy in supine (morning)  TODAY'S TREATMENT:  DATE:  06/01/23  EVAL  Neuromuscular re-education: Diaphragmatic breathing  Cat cow Child's pose Butterfly Happy baby Therapeutic activities: Dilators - looked at models  Silicone lubricant    PATIENT EDUCATION:  Education details: See above Person educated: Patient Education method: Explanation, Demonstration, Tactile cues, Verbal cues, and Handouts Education comprehension: verbalized understanding  HOME EXERCISE PROGRAM: [id number]  ASSESSMENT:  CLINICAL IMPRESSION: Patient is a 1 female y.o. female who was seen today for physical therapy evaluation and treatment for dyspareunia. Exam findings notable for abnormal posture, decreased lumbar extension and rotation, instability in squat and single leg squat, abdominal distortion with curl-up test, very tight and tender abdomen throughout, apical breathing pattern with decreased rib cage mobility, pelvic floor muscle weakness and decreased endurance, high tone and pain in pelvic floor muscles, vulvar/vaginal redness/irritation with decreased tissue bulk, decreased pelvic floor muscle coordination, and grade 1 anterior vaginal wall laxity. Signs and symptoms are most consistent with vaginal tissue atrophy, high tone pelvic floor muscles, and gripping abdominal/pelvic floor muscle patterns. Initial treatment consisted of education on dilators and how they will be beneficial in return to more comfortable intercourse, down training, and silicone lubricant; believe estrogen tablets will also begin to make a  significant improvement as well. She will continue to benefit from skilled PT intervention in order to decrease dyspareunia, improve pelvic floor muscle spasm, begin and progress dilator program, decrease abdominal restriction, improve pressure management and deep core bracing, and progress functional strengthening program.   OBJECTIVE IMPAIRMENTS: decreased activity tolerance, decreased coordination, decreased endurance, decreased mobility, decreased ROM, decreased strength, hypomobility, increased fascial restrictions, increased muscle spasms, impaired flexibility, impaired tone, postural dysfunction, and pain.   ACTIVITY LIMITATIONS: continence  PARTICIPATION LIMITATIONS: interpersonal relationship  PERSONAL FACTORS: 1 comorbidity: medical history are also affecting patient's functional outcome.   REHAB POTENTIAL: Good  CLINICAL DECISION MAKING: Stable/uncomplicated  EVALUATION COMPLEXITY: Low   GOALS: Goals reviewed with patient? Yes  SHORT TERM GOALS: Target date: 06/29/2023    Pt will be independent with HEP.   Baseline: Goal status: INITIAL  2.  Pt will report 25% improvement in pain with intercourse to enjoy intimate activities with partner.  Baseline:  Goal status: INITIAL  3.  Pt will be independent with diaphragmatic breathing and down training activities in order to improve pelvic floor relaxation.  Baseline:  Goal status: INITIAL  4.  Pt will be able to teach back and utilize urge suppression technique in order to help reduce urgency during the day.    Baseline:  Goal status: INITIAL  5.  Pt will be independent with double voiding technique in order to completely empty bladder so she reduced sensation of needing to void throughout the day.  Baseline:  Goal status: INITIAL  6.  Pt will begin working with dilators to help improve vaginal sensitization and relax pelvic floor muscles in order to help return to more comfortable intercourse.  Baseline:  Goal  status: INITIAL  LONG TERM GOALS: Target date: 11/16/23  Pt will be independent with advanced HEP.   Baseline:  Goal status: INITIAL  2.  Pt will report 75% improvement in pain with intercourse to enjoy intimate activities with partner.  Baseline:  Goal status: INITIAL  3.  Pt will demonstrate 3/5 pelvic floor muscle strength with full A/ROM in order to provide good mobility and circulation in the pelvic floor muscles to improve pain with intercourse.  Baseline:  Goal status: INITIAL  4.  Pt will be independent with dilator progression in order to  help return to more comfortable intercourse.  Baseline:  Goal status: INITIAL  5.  Pt will report no episodes of post void dribbling or stress incontinence in order to improve personal hygiene and confidence at work.  Baseline:  Goal status: INITIAL    PLAN:  PT FREQUENCY: 1-2x/week  PT DURATION: 6 months  PLANNED INTERVENTIONS: 97110-Therapeutic exercises, 97530- Therapeutic activity, 97112- Neuromuscular re-education, 97535- Self Care, 16109- Manual therapy, Dry Needling, and Biofeedback  PLAN FOR NEXT SESSION: Education on double voiding, urge drill, the knack; progress mobility and down training; diaphragmatic breathing with weight/theraband; core training; forgot to give silicone lubricant samples - please give!!!   Verlena Glenn, PT, DPT05/13/2512:29 PM

## 2023-06-01 NOTE — Patient Instructions (Signed)
 Vaginal trainers  Prior to Use:   Wash the vaginal trainer with soap and water before and after each use.   Use a water-soluble lubricant like Slippery Stuff or Surgulibe.   Avoid using Vaseline, coconut oil, or other oil-based lubricants. They are not water-soluble and can be irritating to the tissues in the vagina.   Do not use silicon-based lubricants with a silicon vaginal trainer. Using a siliconbased lubricant with a silicon device can contribute to break down of the material.  Setting Up Your Space   Work in a comfortable room lying on your back on a bed or couch with your knees bent and knees relaxed open. Use pillows underneath your knees as they are relaxed open and to support your upper back and head. Place a towel underneath your bottom to collect any lubricant.   Place your vaginal trainers and lubricant on a towel next to you within arm's reach for easy access.   Starting to use your trainer:  o Take 10-20 deep breaths to quiet your nervous system  o Perform stretches to help relax your hips and pelvic floor such as child's pose, cat/cow, or happy baby pose  Using Your Trainers   Coat the smallest vaginal trainer, or the size you are most comfortable using, with lubricant   Place the tip of the trainer at the opening to the vagina.   Take a few deep breaths to adjust to the sensation of the lubricant and trainer.   Slowly insert the rounded end of the trainer into your vaginal opening as far as you are comfortable. Pause and breathe if you experience pain, tension, or muscle guarding at any time. Once you feel comfortable gently slide trainer deeper into the vaginal canal as far as it will go without causing pain or discomfort.  Progressing with your Trainers   Gently press the trainer toward the bottom and sides of the vaginal opening to give it a gentle stretch. Pause and breathe at each spot and tension melts away.   Once fully inserted, turn the trainer clockwise and  counterclockwise to produce different sensations   Slowly move the trainer in and out as you breathe and focus on staying as relaxed as possible  To progress to the next size gradually, once one trainer is completely pain-free and comfortable to use, insert that smaller trainer first for 5-10 minutes and then follow with the next largest size trainer for 5-10 minutes. Gradually decrease the length of time using the smaller trainer as you increase the length of time using the larger trainer.   Move at your pace ad what's comfortable for you.  Wrapping up your session   Use trainers for 5-10 minutes every other day or 3 times a week   Wash and dry your vaginal trainers after use  Other considerations: Try to approach using vaginal trainers from a place of curiosity instead of judgment - what can my body do today, vs. why can't it do this, or I should be able to do this. Try letting go of that idea that it should be different, and try to meet yourself where you are at, without the pressure to change anything Do you bring your vaginal trainers into PT? Sometimes, bringing your trainers into sessions with your PT and having them talk you through the process while you are in control of the trainer can be helpful. Maybe they can help you find ways to make insertion a bit easier for you, or they can  help remind you to breathe. If you aren't doing this, I definitely recommend talking to a PT about it. Sometimes knowing the physical tools you have can help with the mental game. One thing that can be helpful to do before jumping to dilating is called "cupping". It is just taking your hand and holding your palm to your vulva and breathing. Doing this before doing any type of trainer training can be helpful as it lets you take a second and check in, vs jumping right in. Kind of like a warm up to your workout! Try different tools and see if you like another one more. Some of our patients prefer crystal wands or  plastic trainers to silicone, some prefer starting with a vibrating pelvic wand instead of a trainer, look at different options and see what interests you. You can also try different lubricants. And don't feel as though you need to jump right into inserting anything. The first few times (or minutes of the session) may just be about putting it at the entrance without inserting, and that's ok! We have also had patients find success with an external vibrator on their pubic bone while they use trainers as this can help distract nerves and increase muscle relaxation. This can be helpful to normalize the trainers. Leave the one you are currently using and the one you want to progress to somewhere you see it every day, like the bathroom counter or the bedside table. Seeing them every day can make them less intimidating. The more you do something, the more routine it becomes, so setting a vaginal trainers schedule and sticking to it can help make it less intimidating. Last thing that could be helpful to you is to set yourself up a relaxing environment when you use your vaginal trainers. Play your favorite calming music, light your favorite candle, incense, or turn on your diffuser, wear your coziest t-shirt and socks, prop your legs on pillows, anything that feels like a big exhale. Don't distract yourself with tv or a movie. Stay tuned into your body to help maintain relaxation Listening to relaxing music or meditations can also be helpful. Preferences for guided meditations can be so different from person to person so find one you feel relaxed/safe with!   Pelvic Floor Vaginal dilators                                                             Amielle Restore Vaginal Dilator Kit                                                        Vulva Tech                                                                              Restore  Soul Source                                                                                    Intimate Rose                                                                                        Inspire Silicone Dilator Set                                                                                 V Well  Buyer, retail that you pump                                                                               Conservation officer, historic buildings                                                                  Vaginismus Vaginal dilators  Oh Nut for deep vaginal penetration limitation  Most of these dilators you can get on [employer]. The ones you are not able to do then look at the company website or [email or web address]://www.hunt.info/

## 2023-07-29 ENCOUNTER — Ambulatory Visit: Payer: PRIVATE HEALTH INSURANCE | Attending: Obstetrics and Gynecology

## 2023-07-29 DIAGNOSIS — M6281 Muscle weakness (generalized): Secondary | ICD-10-CM | POA: Diagnosis present

## 2023-07-29 DIAGNOSIS — M62838 Other muscle spasm: Secondary | ICD-10-CM | POA: Insufficient documentation

## 2023-07-29 DIAGNOSIS — R293 Abnormal posture: Secondary | ICD-10-CM | POA: Diagnosis present

## 2023-07-29 DIAGNOSIS — R279 Unspecified lack of coordination: Secondary | ICD-10-CM | POA: Diagnosis present

## 2023-07-29 NOTE — Therapy (Signed)
 OUTPATIENT PHYSICAL THERAPY FEMALE PELVIC EVALUATION   Patient Name: Claire Goodwin MRN: [id number] DOB:12-18-1965, 58 y.o., female Today's Date: 07/29/2023  END OF SESSION:  PT End of Session - 07/29/23 1147     Visit Number 2    Number of Visits 10    Date for PT Re-Evaluation 11/16/23    Authorization Type [employer]    Authorization Time Period 02889, [id number], [phone] only codes approved    Authorization - Visit Number 2    Authorization - Number of Visits 10    PT Start Time 1145    PT Stop Time 1225    PT Time Calculation (min) 40 min    Activity Tolerance Patient tolerated treatment well    Behavior During Therapy WFL for tasks assessed/performed          Past Medical History:  Diagnosis Date   Anxiety    Depression    GERD (gastroesophageal reflux disease)    History of migraine headaches    Hypertension    Strep throat    Past Surgical History:  Procedure Laterality Date   CESAREAN SECTION     CHOLECYSTECTOMY N/A 06/10/2018   Procedure: LAPAROSCOPIC CHOLECYSTECTOMY WITH INTRAOPERATIVE CHOLANGIOGRAM;  Surgeon: Ethyl Lenis, MD;  Location: [facility];  Service: General;  Laterality: N/A;   ERCP N/A 06/11/2018   Procedure: ENDOSCOPIC RETROGRADE CHOLANGIOPANCREATOGRAPHY (ERCP);  Surgeon: Burnette Fallow, MD;  Location: [facility];  Service: Endoscopy;  Laterality: N/A;   REMOVAL OF STONES  06/11/2018   Procedure: REMOVAL OF STONES;  Surgeon: Burnette Fallow, MD;  Location: [facility];  Service: Endoscopy;;   SPHINCTEROTOMY  06/11/2018   Procedure: ANNETT;  Surgeon: Burnette Fallow, MD;  Location: [facility];  Service: Endoscopy;;   TONSILLECTOMY     UMBILICAL HERNIA REPAIR  06/10/2018   Procedure: HERNIA REPAIR UMBILICAL ADULT;  Surgeon: Ethyl Lenis, MD;  Location: [facility];  Service: General;;   Patient Active Problem List   Diagnosis Date Noted   Menopausal vaginal dryness 09/03/2022   Vitamin B12 deficiency 09/03/2022   Vitamin D  deficiency 09/03/2022   Cholelithiasis with chronic cholecystitis 06/09/2018   Biliary colic 06/09/2018   GAD (generalized anxiety disorder) 11/26/2012   Depression 11/26/2012   Migraine with aura 11/26/2012   Allergic rhinitis 11/26/2012   GERD (gastroesophageal reflux disease) 11/26/2012   Acne 11/26/2012    PCP: Cyndi Shaver, PA-C  REFERRING PROVIDER: Jeralyn Crutch, MD   REFERRING DIAG: N94.10 (ICD-10-CM) - Dyspareunia in female N95.8 (ICD-10-CM) - Genitourinary syndrome of menopause  THERAPY DIAG:  Muscle weakness (generalized)  Other muscle spasm  Unspecified lack of coordination  Abnormal posture  Rationale for Evaluation and Treatment: Rehabilitation  ONSET DATE: 4 years ago  SUBJECTIVE:  SUBJECTIVE STATEMENT: Pt has purchased intimate rose dilator set and has started using just recently.    PAIN: 07/29/23 Are you having pain? Yes NPRS scale: 8/10 at worst Pain location: Vaginal  Pain type: tight Pain description: intermittent   Aggravating factors: intercourse/vaginal penetration Relieving factors: no vaginal penetration, using estrogen  PRECAUTIONS: None  RED FLAGS: None   WEIGHT BEARING RESTRICTIONS: No  FALLS:  Has patient fallen in last 6 months? 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   PLOF: Independent  PATIENT GOALS: pain free intercourse with husband  PERTINENT HISTORY:  Umbilical hernia repair, cholecystectomy, c-section, 2 vaginal deliveries  BOWEL MOVEMENT: Pain with bowel movement: No Type of bowel movement:Frequency 1x/day usually and Strain occasional straining Fully empty rectum: No Leakage: No Pads: No Fiber supplement/laxative No  URINATION: Pain with urination: No Fully empty bladder: Nowill have  post-void dribbling Stream: sometimes starts and stops - goes to the side  Urgency: No - but mild sensation that she has to go all the time Frequency: every 3 hours during the day; couple of times at night Leakage: Coughing and Sneezing Pads: No  INTERCOURSE:  Ability to have vaginal penetration Yes  Pain with intercourse: Initial Penetration, During Penetration, and Deep Penetration - mostly with deeper penetration Dryness: not nearly as dry now Climax: as not happened for a little while  Marinoff Scale: 1/3 Lubricant: yes - astroglide   PREGNANCY: Vaginal deliveries 2 Tearing Yes:   Episiotomy Yes  C-section deliveries 1 Currently pregnant No  PROLAPSE: Maybe - she is not quite sure   OBJECTIVE:  Note: Objective measures were completed at Evaluation unless otherwise noted.  06/01/23:  PATIENT SURVEYS:   PFIQ-7: 48  COGNITION: Overall cognitive status: Within functional limits for tasks assessed     SENSATION: Light touch: Appears intact   FUNCTIONAL TESTS:  Squat: unstable Single leg stance:  Rt: pelvic drop, unstable  Lt: pelvic drop, unstable Curl-up test: abdominal distortion throughout midline   GAIT: Assistive device utilized: None Comments: WNL  POSTURE: rounded shoulders, forward head, increased lumbar lordosis, increased thoracic kyphosis, and anterior pelvic tilt   LUMBARAROM/PROM:  A/PROM A/PROM  Eval (% available)  Flexion 100  Extension 25  Right lateral flexion WNL  Left lateral flexion WNL  Right rotation 50  Left rotation 50   (Blank rows = not tested)   PALPATION:   General: WNL  Pelvic Alignment: WNL  Abdominal: very tight and tender, decreased rib cage mobility, apical breathing pattern                External Perineal Exam: dry, decreased tissue tone, areas of redness at posterior fourchette and urethra                             Internal Pelvic Floor: very tender; burning around introitus, more notable on  Rt  Patient confirms identification and approves PT to assess internal pelvic floor and treatment Yes  PELVIC MMT:   MMT eval  Vaginal 1/5, 3 second hold, 6 repeat contractions in 10 seconds  Diastasis Recti WNL, but distortion throughout midline  (Blank rows = not tested)        TONE: high  PROLAPSE: Grade 1 anterior vaginal wall laxiy in supine (morning)  TODAY'S TREATMENT:  DATE:  07/29/23 Neuromuscular re-education: Diaphragmatic breathing with multimodal cues to help improve deep core lengthening, especially pelvic floor muscles Down training: Cat cow 15x Child's pose 10 breaths  Happy baby 10 breaths  Butterfly 10 breaths  Exercises: Lower trunk rotation 2 x 10 Tail wags 15x Therapeutic activities: Urge drill Double voiding  The knack Continued dilator education - she is now on the third dilator Mindfulness body scan   06/01/23  EVAL  Neuromuscular re-education: Diaphragmatic breathing  Cat cow Child's pose Butterfly Happy baby Therapeutic activities: Dilators - looked at models  Silicone lubricant    PATIENT EDUCATION:  Education details: See above Person educated: Patient Education method: Explanation, Demonstration, Tactile cues, Verbal cues, and Handouts Education comprehension: verbalized understanding  HOME EXERCISE PROGRAM: [id number]  ASSESSMENT:  CLINICAL IMPRESSION: Pt has not been able to work on exercises or dilators much since her first visit. She is now using dilators some, but not often and using about 20 minutes each time she does. We talked about decreasing length of time and increasing frequency of use. We performed down training program today together and multimodal cues for improved diaphragmatic breathing and relaxation. She will continue to benefit from skilled PT intervention in order to decrease  dyspareunia, improve pelvic floor muscle spasm, begin and progress dilator program, decrease abdominal restriction, improve pressure management and deep core bracing, and progress functional strengthening program.   OBJECTIVE IMPAIRMENTS: decreased activity tolerance, decreased coordination, decreased endurance, decreased mobility, decreased ROM, decreased strength, hypomobility, increased fascial restrictions, increased muscle spasms, impaired flexibility, impaired tone, postural dysfunction, and pain.   ACTIVITY LIMITATIONS: continence  PARTICIPATION LIMITATIONS: interpersonal relationship  PERSONAL FACTORS: 1 comorbidity: medical history are also affecting patient's functional outcome.   REHAB POTENTIAL: Good  CLINICAL DECISION MAKING: Stable/uncomplicated  EVALUATION COMPLEXITY: Low   GOALS: Goals reviewed with patient? Yes  SHORT TERM GOALS: Updated 07/29/23    Pt will be independent with HEP.   Baseline: Goal status: IN PROGRESS 07/29/23  2.  Pt will report 25% improvement in pain with intercourse to enjoy intimate activities with partner.  Baseline:  Goal status: IN PROGRESS 07/29/23  3.  Pt will be independent with diaphragmatic breathing and down training activities in order to improve pelvic floor relaxation.  Baseline:  Goal status: IN PROGRESS 07/29/23  4.  Pt will be able to teach back and utilize urge suppression technique in order to help reduce urgency during the day.    Baseline:  Goal status: IN PROGRESS 07/29/23  5.  Pt will be independent with double voiding technique in order to completely empty bladder so she reduced sensation of needing to void throughout the day.  Baseline:  Goal status: IN PROGRESS 07/29/23  6.  Pt will begin working with dilators to help improve vaginal sensitization and relax pelvic floor muscles in order to help return to more comfortable intercourse.  Baseline:  Goal status: IN PROGRESS 07/29/23  LONG TERM GOALS: Updated  07/29/23  Pt will be independent with advanced HEP.   Baseline:  Goal status: IN PROGRESS 07/29/23  2.  Pt will report 75% improvement in pain with intercourse to enjoy intimate activities with partner.  Baseline:  Goal status: IN PROGRESS 07/29/23  3.  Pt will demonstrate 3/5 pelvic floor muscle strength with full A/ROM in order to provide good mobility and circulation in the pelvic floor muscles to improve pain with intercourse.  Baseline:  Goal status: IN PROGRESS 07/29/23  4.  Pt will be independent with dilator progression  in order to help return to more comfortable intercourse.  Baseline:  Goal status: IN PROGRESS 07/29/23  5.  Pt will report no episodes of post void dribbling or stress incontinence in order to improve personal hygiene and confidence at work.  Baseline:  Goal status: IN PROGRESS 07/29/23    PLAN:  PT FREQUENCY: 1-2x/week  PT DURATION: 6 months  PLANNED INTERVENTIONS: 97110-Therapeutic exercises, 97530- Therapeutic activity, 97112- Neuromuscular re-education, 97535- Self Care, [id number]- Manual therapy, Dry Needling, and Biofeedback  PLAN FOR NEXT SESSION: Education on double voiding, urge drill, the knack; progress mobility and down training; diaphragmatic breathing with weight/theraband; core training   Josette Mares, PT, DPT07/10/2510:25 PM

## 2023-07-29 NOTE — Patient Instructions (Signed)
 Double-voiding:  This technique is to help with post-void dribbling, or leaking a little bit when you stand up right after urinating. Use relaxed toileting mechanics to urinate as much as you feel like you have to without straining. Sit back upright from leaning forward and relax this way for 10-20 seconds. Lean forward again to finish voiding any amount more.    The knack: Use this technique while coughing, laughing, sneezing, or with any activities that causes you to leak urine a little. Right before you perform one of these activities that increase pressure in the abdomen and pushes a little urine out, perform a pelvic floor muscle contraction and hold. If that does not completely stop the leaking, try tightening your thighs together in addition to performing a pelvic floor muscle contraction. Make sure you are not trying to stifle a cough, sneeze, or laugh; allow these activities in full as it will cause less pressure down into the bladder and pelvic floor muscles.    Urge Incontinence  Ideal urination frequency is every 2-4 wakeful hours, which equates to 5-8 times within a 24-hour period.   Urge incontinence is leakage that occurs when the bladder muscle contracts, creating a sudden need to go before getting to the bathroom.   Going too often when your bladder isn't actually full can disrupt the body's automatic signals to store and hold urine longer, which will increase urgency/frequency.  In this case, the bladder "is running the show" and strategies can be learned to retrain this pattern.   One should be able to control the first urge to urinate, at around .  The bladder can hold up to a "grande latte," or . To help you gain control, practice the Urge Drill below when urgency strikes.  This drill will help retrain your bladder signals and allow you to store and hold urine longer.  The overall goal is to stretch out your time between voids to reach a more manageable  voiding schedule.    Practice your quick flicks often throughout the day (each waking hour) even when you don't need feel the urge to go.  This will help strengthen your pelvic floor muscles, making them more effective in controlling leakage.  Urge Drill  When you feel an urge to go, follow these steps to regain control: Stop what you are doing and be still Take one deep breath, directing your air into your abdomen Think an affirming thought, such as "I've got this." Do 5 quick flicks of your pelvic floor Walk with control to the bathroom to void, or delay voiding   Mindfulness body: Claire Goodwin - 10 minute relaxation body scan     [facility] [address] [place], [place] [address] Phone # [phone] Fax [phone]

## 2023-08-05 ENCOUNTER — Ambulatory Visit: Payer: PRIVATE HEALTH INSURANCE

## 2023-08-05 DIAGNOSIS — M6281 Muscle weakness (generalized): Secondary | ICD-10-CM

## 2023-08-05 DIAGNOSIS — M62838 Other muscle spasm: Secondary | ICD-10-CM

## 2023-08-05 DIAGNOSIS — R293 Abnormal posture: Secondary | ICD-10-CM

## 2023-08-05 DIAGNOSIS — R279 Unspecified lack of coordination: Secondary | ICD-10-CM

## 2023-08-05 NOTE — Therapy (Signed)
 OUTPATIENT PHYSICAL THERAPY FEMALE PELVIC EVALUATION   Patient Name: Claire Goodwin MRN: [id number] DOB:19-Apr-1965, 58 y.o., female Today's Date: 08/05/2023  END OF SESSION:  PT End of Session - 08/05/23 1158     Visit Number 3    Number of Visits 10    Date for PT Re-Evaluation 11/16/23    Authorization Type [employer]    Authorization Time Period 02889, [id number], [phone] only codes approved    Authorization - Visit Number 3    Authorization - Number of Visits 10    PT Start Time 1149    PT Stop Time 1230    PT Time Calculation (min) 41 min    Activity Tolerance Patient tolerated treatment well    Behavior During Therapy WFL for tasks assessed/performed          Past Medical History:  Diagnosis Date   Anxiety    Depression    GERD (gastroesophageal reflux disease)    History of migraine headaches    Hypertension    Strep throat    Past Surgical History:  Procedure Laterality Date   CESAREAN SECTION     CHOLECYSTECTOMY N/A 06/10/2018   Procedure: LAPAROSCOPIC CHOLECYSTECTOMY WITH INTRAOPERATIVE CHOLANGIOGRAM;  Surgeon: Ethyl Lenis, MD;  Location: [facility];  Service: General;  Laterality: N/A;   ERCP N/A 06/11/2018   Procedure: ENDOSCOPIC RETROGRADE CHOLANGIOPANCREATOGRAPHY (ERCP);  Surgeon: Burnette Fallow, MD;  Location: [facility];  Service: Endoscopy;  Laterality: N/A;   REMOVAL OF STONES  06/11/2018   Procedure: REMOVAL OF STONES;  Surgeon: Burnette Fallow, MD;  Location: [facility];  Service: Endoscopy;;   SPHINCTEROTOMY  06/11/2018   Procedure: ANNETT;  Surgeon: Burnette Fallow, MD;  Location: [facility];  Service: Endoscopy;;   TONSILLECTOMY     UMBILICAL HERNIA REPAIR  06/10/2018   Procedure: HERNIA REPAIR UMBILICAL ADULT;  Surgeon: Ethyl Lenis, MD;  Location: [facility];  Service: General;;   Patient Active Problem List   Diagnosis Date Noted   Menopausal vaginal dryness 09/03/2022   Vitamin B12 deficiency 09/03/2022   Vitamin D  deficiency 09/03/2022   Cholelithiasis with chronic cholecystitis 06/09/2018   Biliary colic 06/09/2018   GAD (generalized anxiety disorder) 11/26/2012   Depression 11/26/2012   Migraine with aura 11/26/2012   Allergic rhinitis 11/26/2012   GERD (gastroesophageal reflux disease) 11/26/2012   Acne 11/26/2012    PCP: Cyndi Shaver, PA-C  REFERRING PROVIDER: Jeralyn Crutch, MD   REFERRING DIAG: N94.10 (ICD-10-CM) - Dyspareunia in female N95.8 (ICD-10-CM) - Genitourinary syndrome of menopause  THERAPY DIAG:  Muscle weakness (generalized)  Other muscle spasm  Unspecified lack of coordination  Abnormal posture  Rationale for Evaluation and Treatment: Rehabilitation  ONSET DATE: 4 years ago  SUBJECTIVE:  SUBJECTIVE STATEMENT: Pt has not been able to work with dilators this week because her mother had emergency surgery.    PAIN: 07/29/23 Are you having pain? Yes NPRS scale: 8/10 at worst Pain location: Vaginal  Pain type: tight Pain description: intermittent   Aggravating factors: intercourse/vaginal penetration Relieving factors: no vaginal penetration, using estrogen  PRECAUTIONS: None  RED FLAGS: None   WEIGHT BEARING RESTRICTIONS: No  FALLS:  Has patient fallen in last 6 months? 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   PLOF: Independent  PATIENT GOALS: pain free intercourse with husband  PERTINENT HISTORY:  Umbilical hernia repair, cholecystectomy, c-section, 2 vaginal deliveries  BOWEL MOVEMENT: Pain with bowel movement: No Type of bowel movement:Frequency 1x/day usually and Strain occasional straining Fully empty rectum: No Leakage: No Pads: No Fiber supplement/laxative No  URINATION: Pain with urination: No Fully empty bladder:  Nowill have post-void dribbling Stream: sometimes starts and stops - goes to the side  Urgency: No - but mild sensation that she has to go all the time Frequency: every 3 hours during the day; couple of times at night Leakage: Coughing and Sneezing Pads: No  INTERCOURSE:  Ability to have vaginal penetration Yes  Pain with intercourse: Initial Penetration, During Penetration, and Deep Penetration - mostly with deeper penetration Dryness: not nearly as dry now Climax: as not happened for a little while  Marinoff Scale: 1/3 Lubricant: yes - astroglide   PREGNANCY: Vaginal deliveries 2 Tearing Yes:   Episiotomy Yes  C-section deliveries 1 Currently pregnant No  PROLAPSE: Maybe - she is not quite sure   OBJECTIVE:  Note: Objective measures were completed at Evaluation unless otherwise noted.  06/01/23:  PATIENT SURVEYS:   PFIQ-7: 48  COGNITION: Overall cognitive status: Within functional limits for tasks assessed     SENSATION: Light touch: Appears intact   FUNCTIONAL TESTS:  Squat: unstable Single leg stance:  Rt: pelvic drop, unstable  Lt: pelvic drop, unstable Curl-up test: abdominal distortion throughout midline   GAIT: Assistive device utilized: None Comments: WNL  POSTURE: rounded shoulders, forward head, increased lumbar lordosis, increased thoracic kyphosis, and anterior pelvic tilt   LUMBARAROM/PROM:  A/PROM A/PROM  Eval (% available)  Flexion 100  Extension 25  Right lateral flexion WNL  Left lateral flexion WNL  Right rotation 50  Left rotation 50   (Blank rows = not tested)   PALPATION:   General: WNL  Pelvic Alignment: WNL  Abdominal: very tight and tender, decreased rib cage mobility, apical breathing pattern                External Perineal Exam: dry, decreased tissue tone, areas of redness at posterior fourchette and urethra                             Internal Pelvic Floor: very tender; burning around introitus, more notable on  Rt  Patient confirms identification and approves PT to assess internal pelvic floor and treatment Yes  PELVIC MMT:   MMT eval  Vaginal 1/5, 3 second hold, 6 repeat contractions in 10 seconds  Diastasis Recti WNL, but distortion throughout midline  (Blank rows = not tested)        TONE: high  PROLAPSE: Grade 1 anterior vaginal wall laxiy in supine (morning)  TODAY'S TREATMENT:  DATE:  08/05/23 Neuromuscular re-education: Transversus abdominus training with multimodal cues for improved motor control and breath coordination Bil supine UE ball press with transversus abdominus and pelvic floor muscle contractions and breath coordination 10x Seated pelvic tilts  Bridge with hip adduction, transversus abdominus, and pelvic floor muscle 2 x 10 Supine hip adduction ball press with transversus abdominus and pelvic floor muscle contractions and breath coordination 10x Seated hip abduction red band with transversus abdominus and pelvic floor muscle 2 x 10 Seated resisted march red band with transversus abdominus and pelvic floor muscle 2 x 10 Exercises: Seated forward fold 10 breaths Seated lateral side bend 10 breaths  Therapeutic activities: Postural education and correction   07/29/23 Neuromuscular re-education: Diaphragmatic breathing with multimodal cues to help improve deep core lengthening, especially pelvic floor muscles Down training: Cat cow 15x Child's pose 10 breaths  Happy baby 10 breaths  Butterfly 10 breaths  Exercises: Lower trunk rotation 2 x 10 Tail wags 15x Therapeutic activities: Urge drill Double voiding  The knack Continued dilator education - she is now on the third dilator Mindfulness body scan   06/01/23  EVAL  Neuromuscular re-education: Diaphragmatic breathing  Cat cow Child's pose Butterfly Happy baby Therapeutic  activities: Dilators - looked at models  Silicone lubricant    PATIENT EDUCATION:  Education details: See above Person educated: Patient Education method: Explanation, Demonstration, Tactile cues, Verbal cues, and Handouts Education comprehension: verbalized understanding  HOME EXERCISE PROGRAM: [id number]  ASSESSMENT:  CLINICAL IMPRESSION: Carefully observed pt posture during ambulation and she has large increase in lumbar lordosis and has to clench glutes and thoracic/lumbar paraspinals in order to keep rib cage over pelvis, which as created increased rib cage extension and lengthened diaphragm. We discussed postural correction to help allow her pelvic floor muscles to work better. She performed pelvic tilts to help increase awareness and ability to move in and out of anterior pelvic tilt and sage rib cage over pelvis. She reported when sitting with a little more neutral spine instead of in notable extension, it felt like a core work out. This may indicate that her positioning is due to core weakness and avoiding having to use abdominals in functional positions. We also performed core training with breath coordination in order to help improve pelvic floor muscle mobility. She did well with these activities, but required consistent cuing for core activation and breathing. She also needed cues to not rush exercise and take her time. She will continue to benefit from skilled PT intervention in order to decrease dyspareunia, improve pelvic floor muscle spasm, begin and progress dilator program, decrease abdominal restriction, improve pressure management and deep core bracing, and progress functional strengthening program.   OBJECTIVE IMPAIRMENTS: decreased activity tolerance, decreased coordination, decreased endurance, decreased mobility, decreased ROM, decreased strength, hypomobility, increased fascial restrictions, increased muscle spasms, impaired flexibility, impaired tone, postural  dysfunction, and pain.   ACTIVITY LIMITATIONS: continence  PARTICIPATION LIMITATIONS: interpersonal relationship  PERSONAL FACTORS: 1 comorbidity: medical history are also affecting patient's functional outcome.   REHAB POTENTIAL: Good  CLINICAL DECISION MAKING: Stable/uncomplicated  EVALUATION COMPLEXITY: Low   GOALS: Goals reviewed with patient? Yes  SHORT TERM GOALS: Updated 07/29/23    Pt will be independent with HEP.   Baseline: Goal status: IN PROGRESS 07/29/23  2.  Pt will report 25% improvement in pain with intercourse to enjoy intimate activities with partner.  Baseline:  Goal status: IN PROGRESS 07/29/23  3.  Pt will be independent with diaphragmatic breathing and down training  activities in order to improve pelvic floor relaxation.  Baseline:  Goal status: IN PROGRESS 07/29/23  4.  Pt will be able to teach back and utilize urge suppression technique in order to help reduce urgency during the day.    Baseline:  Goal status: IN PROGRESS 07/29/23  5.  Pt will be independent with double voiding technique in order to completely empty bladder so she reduced sensation of needing to void throughout the day.  Baseline:  Goal status: IN PROGRESS 07/29/23  6.  Pt will begin working with dilators to help improve vaginal sensitization and relax pelvic floor muscles in order to help return to more comfortable intercourse.  Baseline:  Goal status: IN PROGRESS 07/29/23  LONG TERM GOALS: Updated 07/29/23  Pt will be independent with advanced HEP.   Baseline:  Goal status: IN PROGRESS 07/29/23  2.  Pt will report 75% improvement in pain with intercourse to enjoy intimate activities with partner.  Baseline:  Goal status: IN PROGRESS 07/29/23  3.  Pt will demonstrate 3/5 pelvic floor muscle strength with full A/ROM in order to provide good mobility and circulation in the pelvic floor muscles to improve pain with intercourse.  Baseline:  Goal status: IN PROGRESS  07/29/23  4.  Pt will be independent with dilator progression in order to help return to more comfortable intercourse.  Baseline:  Goal status: IN PROGRESS 07/29/23  5.  Pt will report no episodes of post void dribbling or stress incontinence in order to improve personal hygiene and confidence at work.  Baseline:  Goal status: IN PROGRESS 07/29/23    PLAN:  PT FREQUENCY: 1-2x/week  PT DURATION: 6 months  PLANNED INTERVENTIONS: 97110-Therapeutic exercises, 97530- Therapeutic activity, 97112- Neuromuscular re-education, 97535- Self Care, 02859- Manual therapy, Dry Needling, and Biofeedback  PLAN FOR NEXT SESSION: Education on double voiding, urge drill, the knack; progress mobility and down training; diaphragmatic breathing with weight/theraband; core training   Josette Mares, PT, DPT07/17/2512:33 PM

## 2023-08-12 ENCOUNTER — Ambulatory Visit: Payer: PRIVATE HEALTH INSURANCE

## 2023-08-12 DIAGNOSIS — M62838 Other muscle spasm: Secondary | ICD-10-CM

## 2023-08-12 DIAGNOSIS — R279 Unspecified lack of coordination: Secondary | ICD-10-CM

## 2023-08-12 DIAGNOSIS — R293 Abnormal posture: Secondary | ICD-10-CM

## 2023-08-12 DIAGNOSIS — M6281 Muscle weakness (generalized): Secondary | ICD-10-CM | POA: Diagnosis not present

## 2023-08-12 NOTE — Therapy (Signed)
 OUTPATIENT PHYSICAL THERAPY FEMALE PELVIC EVALUATION   Patient Name: Claire Goodwin MRN: [id number] DOB:22-Aug-1965, 58 y.o., female Today's Date: 08/12/2023  END OF SESSION:  PT End of Session - 08/12/23 1149     Visit Number 4    Number of Visits 10    Date for PT Re-Evaluation 11/16/23    Authorization Type [employer]    Authorization Time Period 02889, 97140, [phone] only codes approved    Authorization - Visit Number 4    Authorization - Number of Visits 10    PT Start Time 1145    PT Stop Time 1226    PT Time Calculation (min) 41 min    Activity Tolerance Patient tolerated treatment well    Behavior During Therapy WFL for tasks assessed/performed          Past Medical History:  Diagnosis Date   Anxiety    Depression    GERD (gastroesophageal reflux disease)    History of migraine headaches    Hypertension    Strep throat    Past Surgical History:  Procedure Laterality Date   CESAREAN SECTION     CHOLECYSTECTOMY N/A 06/10/2018   Procedure: LAPAROSCOPIC CHOLECYSTECTOMY WITH INTRAOPERATIVE CHOLANGIOGRAM;  Surgeon: Ethyl Lenis, MD;  Location: [facility];  Service: General;  Laterality: N/A;   ERCP N/A 06/11/2018   Procedure: ENDOSCOPIC RETROGRADE CHOLANGIOPANCREATOGRAPHY (ERCP);  Surgeon: Burnette Fallow, MD;  Location: [facility];  Service: Endoscopy;  Laterality: N/A;   REMOVAL OF STONES  06/11/2018   Procedure: REMOVAL OF STONES;  Surgeon: Burnette Fallow, MD;  Location: [facility];  Service: Endoscopy;;   SPHINCTEROTOMY  06/11/2018   Procedure: ANNETT;  Surgeon: Burnette Fallow, MD;  Location: [facility];  Service: Endoscopy;;   TONSILLECTOMY     UMBILICAL HERNIA REPAIR  06/10/2018   Procedure: HERNIA REPAIR UMBILICAL ADULT;  Surgeon: Ethyl Lenis, MD;  Location: [facility];  Service: General;;   Patient Active Problem List   Diagnosis Date Noted   Menopausal vaginal dryness 09/03/2022   Vitamin B12 deficiency 09/03/2022   Vitamin D  deficiency 09/03/2022   Cholelithiasis with chronic cholecystitis 06/09/2018   Biliary colic 06/09/2018   GAD (generalized anxiety disorder) 11/26/2012   Depression 11/26/2012   Migraine with aura 11/26/2012   Allergic rhinitis 11/26/2012   GERD (gastroesophageal reflux disease) 11/26/2012   Acne 11/26/2012    PCP: Cyndi Shaver, PA-C  REFERRING PROVIDER: Jeralyn Crutch, MD   REFERRING DIAG: N94.10 (ICD-10-CM) - Dyspareunia in female N95.8 (ICD-10-CM) - Genitourinary syndrome of menopause  THERAPY DIAG:  Muscle weakness (generalized)  Other muscle spasm  Unspecified lack of coordination  Abnormal posture  Rationale for Evaluation and Treatment: Rehabilitation  ONSET DATE: 4 years ago  SUBJECTIVE:  SUBJECTIVE STATEMENT: Pt was able to work with dilators 4x in the last week.     PAIN: 07/29/23 Are you having pain? Yes NPRS scale: 8/10 at worst Pain location: Vaginal  Pain type: tight Pain description: intermittent   Aggravating factors: intercourse/vaginal penetration Relieving factors: no vaginal penetration, using estrogen  PRECAUTIONS: None  RED FLAGS: None   WEIGHT BEARING RESTRICTIONS: No  FALLS:  Has patient fallen in last 6 months? 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   PLOF: Independent  PATIENT GOALS: pain free intercourse with husband  PERTINENT HISTORY:  Umbilical hernia repair, cholecystectomy, c-section, 2 vaginal deliveries  BOWEL MOVEMENT: Pain with bowel movement: No Type of bowel movement:Frequency 1x/day usually and Strain occasional straining Fully empty rectum: No Leakage: No Pads: No Fiber supplement/laxative No  URINATION: Pain with urination: No Fully empty bladder: Nowill have post-void dribbling Stream:  sometimes starts and stops - goes to the side  Urgency: No - but mild sensation that she has to go all the time Frequency: every 3 hours during the day; couple of times at night Leakage: Coughing and Sneezing Pads: No  INTERCOURSE:  Ability to have vaginal penetration Yes  Pain with intercourse: Initial Penetration, During Penetration, and Deep Penetration - mostly with deeper penetration Dryness: not nearly as dry now Climax: as not happened for a little while  Marinoff Scale: 1/3 Lubricant: yes - astroglide   PREGNANCY: Vaginal deliveries 2 Tearing Yes:   Episiotomy Yes  C-section deliveries 1 Currently pregnant No  PROLAPSE: Maybe - she is not quite sure   OBJECTIVE:  Note: Objective measures were completed at Evaluation unless otherwise noted.  06/01/23:  PATIENT SURVEYS:   PFIQ-7: 48  COGNITION: Overall cognitive status: Within functional limits for tasks assessed     SENSATION: Light touch: Appears intact   FUNCTIONAL TESTS:  Squat: unstable Single leg stance:  Rt: pelvic drop, unstable  Lt: pelvic drop, unstable Curl-up test: abdominal distortion throughout midline   GAIT: Assistive device utilized: None Comments: WNL  POSTURE: rounded shoulders, forward head, increased lumbar lordosis, increased thoracic kyphosis, and anterior pelvic tilt   LUMBARAROM/PROM:  A/PROM A/PROM  Eval (% available)  Flexion 100  Extension 25  Right lateral flexion WNL  Left lateral flexion WNL  Right rotation 50  Left rotation 50   (Blank rows = not tested)   PALPATION:   General: WNL  Pelvic Alignment: WNL  Abdominal: very tight and tender, decreased rib cage mobility, apical breathing pattern                External Perineal Exam: dry, decreased tissue tone, areas of redness at posterior fourchette and urethra                             Internal Pelvic Floor: very tender; burning around introitus, more notable on Rt  Patient confirms identification  and approves PT to assess internal pelvic floor and treatment Yes  PELVIC MMT:   MMT eval  Vaginal 1/5, 3 second hold, 6 repeat contractions in 10 seconds  Diastasis Recti WNL, but distortion throughout midline  (Blank rows = not tested)        TONE: high  PROLAPSE: Grade 1 anterior vaginal wall laxiy in supine (morning)  TODAY'S TREATMENT:  DATE:  08/12/23 Manual: Pt provides verbal consent for internal vaginal/rectal pelvic floor exam. Internal pelvic floor muscle release vaginally in supine Vaginal trigger point release in bil levator ani, Rt>Lt Neuromuscular re-education: Pelvic floor muscle desensitization  Diaphragmatic breathing for improved pelvic floor muscle relaxation/circulation Therapeutic activities: Dilator progress - activities to help increase friction to better prepare for comfortable intercourse   08/05/23 Neuromuscular re-education: Transversus abdominus training with multimodal cues for improved motor control and breath coordination Bil supine UE ball press with transversus abdominus and pelvic floor muscle contractions and breath coordination 10x Seated pelvic tilts  Bridge with hip adduction, transversus abdominus, and pelvic floor muscle 2 x 10 Supine hip adduction ball press with transversus abdominus and pelvic floor muscle contractions and breath coordination 10x Seated hip abduction red band with transversus abdominus and pelvic floor muscle 2 x 10 Seated resisted march red band with transversus abdominus and pelvic floor muscle 2 x 10 Exercises: Seated forward fold 10 breaths Seated lateral side bend 10 breaths  Therapeutic activities: Postural education and correction   07/29/23 Neuromuscular re-education: Diaphragmatic breathing with multimodal cues to help improve deep core lengthening, especially pelvic floor  muscles Down training: Cat cow 15x Child's pose 10 breaths  Happy baby 10 breaths  Butterfly 10 breaths  Exercises: Lower trunk rotation 2 x 10 Tail wags 15x Therapeutic activities: Urge drill Double voiding  The knack Continued dilator education - she is now on the third dilator Mindfulness body scan   PATIENT EDUCATION:  Education details: See above Person educated: Patient Education method: Programmer, multimedia, Demonstration, Tactile cues, Verbal cues, and Handouts Education comprehension: verbalized understanding  HOME EXERCISE PROGRAM: [id number]  ASSESSMENT:  CLINICAL IMPRESSION: We discussed how to progress dilators when she is ready and performing more movement based activities with them to work on getting used to increased friction. We focused on internal pelvic floor muscle release/trigger point release vaginally today. She demonstrated notable myofascial restriction, Rt>Lt, but had good tolerance to all release techniques and significant improvement in tenderness at end of session. HEP the same. She will continue to benefit from skilled PT intervention in order to decrease dyspareunia, improve pelvic floor muscle spasm, begin and progress dilator program, decrease abdominal restriction, improve pressure management and deep core bracing, and progress functional strengthening program.   OBJECTIVE IMPAIRMENTS: decreased activity tolerance, decreased coordination, decreased endurance, decreased mobility, decreased ROM, decreased strength, hypomobility, increased fascial restrictions, increased muscle spasms, impaired flexibility, impaired tone, postural dysfunction, and pain.   ACTIVITY LIMITATIONS: continence  PARTICIPATION LIMITATIONS: interpersonal relationship  PERSONAL FACTORS: 1 comorbidity: medical history are also affecting patient's functional outcome.   REHAB POTENTIAL: Good  CLINICAL DECISION MAKING: Stable/uncomplicated  EVALUATION COMPLEXITY:  Low   GOALS: Goals reviewed with patient? Yes  SHORT TERM GOALS: Updated 07/29/23    Pt will be independent with HEP.   Baseline: Goal status: IN PROGRESS 07/29/23  2.  Pt will report 25% improvement in pain with intercourse to enjoy intimate activities with partner.  Baseline:  Goal status: IN PROGRESS 07/29/23  3.  Pt will be independent with diaphragmatic breathing and down training activities in order to improve pelvic floor relaxation.  Baseline:  Goal status: IN PROGRESS 07/29/23  4.  Pt will be able to teach back and utilize urge suppression technique in order to help reduce urgency during the day.    Baseline:  Goal status: IN PROGRESS 07/29/23  5.  Pt will be independent with double voiding technique in order to completely empty bladder so  she reduced sensation of needing to void throughout the day.  Baseline:  Goal status: IN PROGRESS 07/29/23  6.  Pt will begin working with dilators to help improve vaginal sensitization and relax pelvic floor muscles in order to help return to more comfortable intercourse.  Baseline:  Goal status: IN PROGRESS 07/29/23  LONG TERM GOALS: Updated 07/29/23  Pt will be independent with advanced HEP.   Baseline:  Goal status: IN PROGRESS 07/29/23  2.  Pt will report 75% improvement in pain with intercourse to enjoy intimate activities with partner.  Baseline:  Goal status: IN PROGRESS 07/29/23  3.  Pt will demonstrate 3/5 pelvic floor muscle strength with full A/ROM in order to provide good mobility and circulation in the pelvic floor muscles to improve pain with intercourse.  Baseline:  Goal status: IN PROGRESS 07/29/23  4.  Pt will be independent with dilator progression in order to help return to more comfortable intercourse.  Baseline:  Goal status: IN PROGRESS 07/29/23  5.  Pt will report no episodes of post void dribbling or stress incontinence in order to improve personal hygiene and confidence at work.  Baseline:  Goal  status: IN PROGRESS 07/29/23    PLAN:  PT FREQUENCY: 1-2x/week  PT DURATION: 6 months  PLANNED INTERVENTIONS: 97110-Therapeutic exercises, 97530- Therapeutic activity, 97112- Neuromuscular re-education, 97535- Self Care, 02859- Manual therapy, Dry Needling, and Biofeedback  PLAN FOR NEXT SESSION: Education on double voiding, urge drill, the knack; progress mobility and down training; diaphragmatic breathing with weight/theraband; core training   Josette Mares, PT, DPT07/24/2512:29 PM

## 2023-08-19 ENCOUNTER — Ambulatory Visit: Payer: PRIVATE HEALTH INSURANCE

## 2023-08-19 DIAGNOSIS — R279 Unspecified lack of coordination: Secondary | ICD-10-CM

## 2023-08-19 DIAGNOSIS — R293 Abnormal posture: Secondary | ICD-10-CM

## 2023-08-19 DIAGNOSIS — M6281 Muscle weakness (generalized): Secondary | ICD-10-CM

## 2023-08-19 DIAGNOSIS — M62838 Other muscle spasm: Secondary | ICD-10-CM

## 2023-08-19 NOTE — Therapy (Signed)
 OUTPATIENT PHYSICAL THERAPY FEMALE PELVIC TREATMENT   Patient Name: Claire Goodwin MRN: [id number] DOB:12-18-1965, 58 y.o., female Today's Date: 08/19/2023  END OF SESSION:  PT End of Session - 08/19/23 1149     Visit Number 5    Number of Visits 10    Date for PT Re-Evaluation 11/16/23    Authorization Type [employer]    Authorization Time Period 02889, 97140, [id number] only codes approved    Authorization - Visit Number 5    Authorization - Number of Visits 10    PT Start Time 1148    PT Stop Time 1228    PT Time Calculation (min) 40 min    Activity Tolerance Patient tolerated treatment well    Behavior During Therapy WFL for tasks assessed/performed          Past Medical History:  Diagnosis Date   Anxiety    Depression    GERD (gastroesophageal reflux disease)    History of migraine headaches    Hypertension    Strep throat    Past Surgical History:  Procedure Laterality Date   CESAREAN SECTION     CHOLECYSTECTOMY N/A 06/10/2018   Procedure: LAPAROSCOPIC CHOLECYSTECTOMY WITH INTRAOPERATIVE CHOLANGIOGRAM;  Surgeon: Ethyl Lenis, MD;  Location: [facility];  Service: General;  Laterality: N/A;   ERCP N/A 06/11/2018   Procedure: ENDOSCOPIC RETROGRADE CHOLANGIOPANCREATOGRAPHY (ERCP);  Surgeon: Burnette Fallow, MD;  Location: [facility];  Service: Endoscopy;  Laterality: N/A;   REMOVAL OF STONES  06/11/2018   Procedure: REMOVAL OF STONES;  Surgeon: Burnette Fallow, MD;  Location: [facility];  Service: Endoscopy;;   SPHINCTEROTOMY  06/11/2018   Procedure: ANNETT;  Surgeon: Burnette Fallow, MD;  Location: [facility];  Service: Endoscopy;;   TONSILLECTOMY     UMBILICAL HERNIA REPAIR  06/10/2018   Procedure: HERNIA REPAIR UMBILICAL ADULT;  Surgeon: Ethyl Lenis, MD;  Location: [facility];  Service: General;;   Patient Active Problem List   Diagnosis Date Noted   Menopausal vaginal dryness 09/03/2022   Vitamin B12 deficiency 09/03/2022   Vitamin D  deficiency 09/03/2022   Cholelithiasis with chronic cholecystitis 06/09/2018   Biliary colic 06/09/2018   GAD (generalized anxiety disorder) 11/26/2012   Depression 11/26/2012   Migraine with aura 11/26/2012   Allergic rhinitis 11/26/2012   GERD (gastroesophageal reflux disease) 11/26/2012   Acne 11/26/2012    PCP: Cyndi Shaver, PA-C  REFERRING PROVIDER: Jeralyn Crutch, MD   REFERRING DIAG: N94.10 (ICD-10-CM) - Dyspareunia in female N95.8 (ICD-10-CM) - Genitourinary syndrome of menopause  THERAPY DIAG:  Muscle weakness (generalized)  Other muscle spasm  Unspecified lack of coordination  Abnormal posture  Rationale for Evaluation and Treatment: Rehabilitation  ONSET DATE: 4 years ago  SUBJECTIVE:  SUBJECTIVE STATEMENT: Pt states that she is working hard on posture and core exercises. She is working with dilators and has been working on using larger size with some mild discomfort.    PAIN: 07/29/23 Are you having pain? Yes NPRS scale: 8/10 at worst Pain location: Vaginal  Pain type: tight Pain description: intermittent   Aggravating factors: intercourse/vaginal penetration Relieving factors: no vaginal penetration, using estrogen  PRECAUTIONS: None  RED FLAGS: None   WEIGHT BEARING RESTRICTIONS: No  FALLS:  Has patient fallen in last 6 months? 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   PLOF: Independent  PATIENT GOALS: pain free intercourse with husband  PERTINENT HISTORY:  Umbilical hernia repair, cholecystectomy, c-section, 2 vaginal deliveries  BOWEL MOVEMENT: Pain with bowel movement: No Type of bowel movement:Frequency 1x/day usually and Strain occasional straining Fully empty rectum: No Leakage: No Pads: No Fiber  supplement/laxative No  URINATION: Pain with urination: No Fully empty bladder: Nowill have post-void dribbling Stream: sometimes starts and stops - goes to the side  Urgency: No - but mild sensation that she has to go all the time Frequency: every 3 hours during the day; couple of times at night Leakage: Coughing and Sneezing Pads: No  INTERCOURSE:  Ability to have vaginal penetration Yes  Pain with intercourse: Initial Penetration, During Penetration, and Deep Penetration - mostly with deeper penetration Dryness: not nearly as dry now Climax: as not happened for a little while  Marinoff Scale: 1/3 Lubricant: yes - astroglide   PREGNANCY: Vaginal deliveries 2 Tearing Yes:   Episiotomy Yes  C-section deliveries 1 Currently pregnant No  PROLAPSE: Maybe - she is not quite sure   OBJECTIVE:  Note: Objective measures were completed at Evaluation unless otherwise noted.  06/01/23:  PATIENT SURVEYS:   PFIQ-7: 48  COGNITION: Overall cognitive status: Within functional limits for tasks assessed     SENSATION: Light touch: Appears intact   FUNCTIONAL TESTS:  Squat: unstable Single leg stance:  Rt: pelvic drop, unstable  Lt: pelvic drop, unstable Curl-up test: abdominal distortion throughout midline   GAIT: Assistive device utilized: None Comments: WNL  POSTURE: rounded shoulders, forward head, increased lumbar lordosis, increased thoracic kyphosis, and anterior pelvic tilt   LUMBARAROM/PROM:  A/PROM A/PROM  Eval (% available)  Flexion 100  Extension 25  Right lateral flexion WNL  Left lateral flexion WNL  Right rotation 50  Left rotation 50   (Blank rows = not tested)   PALPATION:   General: WNL  Pelvic Alignment: WNL  Abdominal: very tight and tender, decreased rib cage mobility, apical breathing pattern                External Perineal Exam: dry, decreased tissue tone, areas of redness at posterior fourchette and urethra                              Internal Pelvic Floor: very tender; burning around introitus, more notable on Rt  Patient confirms identification and approves PT to assess internal pelvic floor and treatment Yes  PELVIC MMT:   MMT eval  Vaginal 1/5, 3 second hold, 6 repeat contractions in 10 seconds  Diastasis Recti WNL, but distortion throughout midline  (Blank rows = not tested)        TONE: high  PROLAPSE: Grade 1 anterior vaginal wall laxiy in supine (morning)  TODAY'S TREATMENT:  DATE:  08/19/23 Manual: Pt provides verbal consent for internal vaginal/rectal pelvic floor exam. Internal pelvic floor muscle release vaginally in supine Vaginal trigger point release in bil levator ani, Rt>Lt Neuromuscular re-education: Pelvic floor muscle desensitization  Diaphragmatic breathing for improved pelvic floor muscle relaxation/circulation  08/12/23 Manual: Pt provides verbal consent for internal vaginal/rectal pelvic floor exam. Internal pelvic floor muscle release vaginally in supine Vaginal trigger point release in bil levator ani, Rt>Lt Neuromuscular re-education: Pelvic floor muscle desensitization  Diaphragmatic breathing for improved pelvic floor muscle relaxation/circulation Therapeutic activities: Dilator progress - activities to help increase friction to better prepare for comfortable intercourse   08/05/23 Neuromuscular re-education: Transversus abdominus training with multimodal cues for improved motor control and breath coordination Bil supine UE ball press with transversus abdominus and pelvic floor muscle contractions and breath coordination 10x Seated pelvic tilts  Bridge with hip adduction, transversus abdominus, and pelvic floor muscle 2 x 10 Supine hip adduction ball press with transversus abdominus and pelvic floor muscle contractions and breath coordination  10x Seated hip abduction red band with transversus abdominus and pelvic floor muscle 2 x 10 Seated resisted march red band with transversus abdominus and pelvic floor muscle 2 x 10 Exercises: Seated forward fold 10 breaths Seated lateral side bend 10 breaths  Therapeutic activities: Postural education and correction   PATIENT EDUCATION:  Education details: See above Person educated: Patient Education method: Programmer, multimedia, Demonstration, Actor cues, Verbal cues, and Handouts Education comprehension: verbalized understanding  HOME EXERCISE PROGRAM: [id number]  ASSESSMENT:  CLINICAL IMPRESSION: Pt presents wit hnotable postural improvements today, having reduced lumbar lordosis. Due to having performed dilator work only 2x in the last week, we decided to focus on internal manual pelvic floor muscle release and desensitization; she was encouraged to perform dilators at least 4 times before next visit and then we will plan to focus on strengthening progressions. She demonstrated some great improvements in pelvic floor muscle tone and decreased sensitivity with manual techniques today. She did have more residual tightness remaining in deeper posterior aspects of pelvic floor muscles, but good tolerance to release techniques today. She will continue to benefit from skilled PT intervention in order to decrease dyspareunia, improve pelvic floor muscle spasm, begin and progress dilator program, decrease abdominal restriction, improve pressure management and deep core bracing, and progress functional strengthening program.   OBJECTIVE IMPAIRMENTS: decreased activity tolerance, decreased coordination, decreased endurance, decreased mobility, decreased ROM, decreased strength, hypomobility, increased fascial restrictions, increased muscle spasms, impaired flexibility, impaired tone, postural dysfunction, and pain.   ACTIVITY LIMITATIONS: continence  PARTICIPATION LIMITATIONS: interpersonal  relationship  PERSONAL FACTORS: 1 comorbidity: medical history are also affecting patient's functional outcome.   REHAB POTENTIAL: Good  CLINICAL DECISION MAKING: Stable/uncomplicated  EVALUATION COMPLEXITY: Low   GOALS: Goals reviewed with patient? Yes  SHORT TERM GOALS: Updated 07/29/23    Pt will be independent with HEP.   Baseline: Goal status: IN PROGRESS 07/29/23  2.  Pt will report 25% improvement in pain with intercourse to enjoy intimate activities with partner.  Baseline:  Goal status: IN PROGRESS 07/29/23  3.  Pt will be independent with diaphragmatic breathing and down training activities in order to improve pelvic floor relaxation.  Baseline:  Goal status: IN PROGRESS 07/29/23  4.  Pt will be able to teach back and utilize urge suppression technique in order to help reduce urgency during the day.    Baseline:  Goal status: IN PROGRESS 07/29/23  5.  Pt will be independent with double voiding technique  in order to completely empty bladder so she reduced sensation of needing to void throughout the day.  Baseline:  Goal status: IN PROGRESS 07/29/23  6.  Pt will begin working with dilators to help improve vaginal sensitization and relax pelvic floor muscles in order to help return to more comfortable intercourse.  Baseline:  Goal status: IN PROGRESS 07/29/23  LONG TERM GOALS: Updated 07/29/23  Pt will be independent with advanced HEP.   Baseline:  Goal status: IN PROGRESS 07/29/23  2.  Pt will report 75% improvement in pain with intercourse to enjoy intimate activities with partner.  Baseline:  Goal status: IN PROGRESS 07/29/23  3.  Pt will demonstrate 3/5 pelvic floor muscle strength with full A/ROM in order to provide good mobility and circulation in the pelvic floor muscles to improve pain with intercourse.  Baseline:  Goal status: IN PROGRESS 07/29/23  4.  Pt will be independent with dilator progression in order to help return to more comfortable  intercourse.  Baseline:  Goal status: IN PROGRESS 07/29/23  5.  Pt will report no episodes of post void dribbling or stress incontinence in order to improve personal hygiene and confidence at work.  Baseline:  Goal status: IN PROGRESS 07/29/23    PLAN:  PT FREQUENCY: 1-2x/week  PT DURATION: 6 months  PLANNED INTERVENTIONS: 97110-Therapeutic exercises, 97530- Therapeutic activity, 97112- Neuromuscular re-education, 97535- Self Care, 02859- Manual therapy, Dry Needling, and Biofeedback  PLAN FOR NEXT SESSION: Education on double voiding, urge drill, the knack; progress mobility and down training; diaphragmatic breathing with weight/theraband; core training   Josette Mares, PT, DPT07/31/2511:50 AM

## 2023-08-27 ENCOUNTER — Encounter: Payer: Self-pay | Admitting: Physician Assistant

## 2023-08-27 ENCOUNTER — Ambulatory Visit: Payer: PRIVATE HEALTH INSURANCE | Admitting: Physician Assistant

## 2023-08-27 VITALS — BP 123/75 | HR 76 | Ht <= 58 in | Wt 120.4 lb

## 2023-08-27 DIAGNOSIS — E782 Mixed hyperlipidemia: Secondary | ICD-10-CM

## 2023-08-27 DIAGNOSIS — F411 Generalized anxiety disorder: Secondary | ICD-10-CM | POA: Diagnosis not present

## 2023-08-27 DIAGNOSIS — F330 Major depressive disorder, recurrent, mild: Secondary | ICD-10-CM

## 2023-08-27 DIAGNOSIS — R0981 Nasal congestion: Secondary | ICD-10-CM | POA: Diagnosis not present

## 2023-08-27 DIAGNOSIS — G43109 Migraine with aura, not intractable, without status migrainosus: Secondary | ICD-10-CM

## 2023-08-27 DIAGNOSIS — R739 Hyperglycemia, unspecified: Secondary | ICD-10-CM

## 2023-08-27 MED ORDER — AZELASTINE HCL 0.1 % NA SOLN: 1.0000 | Freq: Two times a day (BID) | NASAL | 3 refills | Status: DC

## 2023-08-27 MED ORDER — BUPROPION HCL ER (XL) 300 MG PO TB24: 300.0000 mg | ORAL_TABLET | Freq: Every day | ORAL | 3 refills | Status: DC

## 2023-08-27 MED ORDER — ESCITALOPRAM OXALATE 20 MG PO TABS
20.0000 mg | ORAL_TABLET | Freq: Every day | ORAL | 1 refills | Status: DC
Start: 2023-08-27 — End: 2023-10-01

## 2023-08-27 NOTE — Progress Notes (Signed)
 Established patient visit   Patient: Claire Goodwin   DOB: 08/10/1965   58 y.o. Female  MRN: [id number] Visit Date: 08/27/2023  Today's healthcare provider: Manuelita Flatness, PA-C  Cc. Sinus pressure  Subjective     Discussed the use of AI scribe software for clinical note transcription with the patient, who gave verbal consent to proceed.  History of Present Illness   Claire Goodwin is a 58 year old female who presents with sinus pressure and migraines.  She experiences sinus pressure over the past few weeks, which triggers her migraines. Flonase  and a saline rinse are used daily, but she still has some pressure and occasional ear pressure and pain. She resumed a magnesium  and vitamin B supplement, which provides some migraine relief. She discontinued nadolol  due to drowsiness but has it available if needed.   Medications: Outpatient Medications Prior to Visit  Medication Sig   cyanocobalamin  (VITAMIN B12) 1000 MCG/ML injection Inject 1 mL (1,000 mcg total) into the muscle every 30 (thirty) days.   cyclobenzaprine (FLEXERIL) 5 MG tablet Take by mouth.   Estradiol  10 MCG TABS vaginal tablet Place 1 tablet (10 mcg total) vaginally 2 (two) times a week. Administer nightly for 2 weeks and then twice weekly thereafter   fluticasone  (FLONASE ) 50 MCG/ACT nasal spray Place 2 sprays into both nostrils daily.   ibuprofen (ADVIL) 800 MG tablet Take by mouth.   Multiple Vitamin (MULTIVITAMIN) tablet Take 1 tablet by mouth daily.   [DISCONTINUED] buPROPion  (WELLBUTRIN  XL) 300 MG 24 hr tablet Take 1 tablet (300 mg total) by mouth daily.   [DISCONTINUED] diphenhydrAMINE (BENADRYL) 25 mg capsule Take by mouth. (Patient not taking: Reported on 04/02/2023)   [DISCONTINUED] escitalopram  (LEXAPRO ) 20 MG tablet Take 1 tablet (20 mg total) by mouth daily.   [DISCONTINUED] Insulin Syringe-Needle U-100 (GLOBAL INJECT EASE INSULIN SYR) 30G X 1/2 0.5 ML MISC Use 1 syringe every 14 days (Patient not  taking: Reported on 04/02/2023)   [DISCONTINUED] nadolol  (CORGARD ) 20 MG tablet Take 5 mg by mouth daily. 1/4 tablet daily (Patient not taking: Reported on 04/02/2023)   [DISCONTINUED] Syringe/Needle, Disp, (SYRINGE 3CC/25GX1) 25G X 1 3 ML MISC Use with vit b12 injections, once weekly, to inject 1 mL intramuscularly. (Patient not taking: Reported on 04/02/2023)   No facility-administered medications prior to visit.    Review of Systems  Constitutional:  Negative for fatigue and fever.  HENT:  Positive for congestion.   Respiratory:  Negative for cough and shortness of breath.   Cardiovascular:  Negative for chest pain and leg swelling.  Gastrointestinal:  Negative for abdominal pain.  Neurological:  Positive for headaches. Negative for dizziness.      08/27/2023    2:07 PM 09/03/2022    2:24 PM  PHQ9 SCORE ONLY  PHQ-9 Total Score 1 0      08/27/2023    2:07 PM  GAD 7 : Generalized Anxiety Score  Nervous, Anxious, on Edge 1  Control/stop worrying 0  Worry too much - different things 0  Trouble relaxing 1  Restless 0  Easily annoyed or irritable 0  Afraid - awful might happen 0  Total GAD 7 Score 2  Anxiety Difficulty Not difficult at all     BP 123/75   Pulse 76   Ht 4' 10 (1.473 m)   Wt 120 lb 6.4 oz (54.6 kg)   BMI 25.16 kg/m    Physical Exam Constitutional:      General: She  is awake.     Appearance: She is well-developed.  HENT:     Head: Normocephalic.     Right Ear: Tympanic membrane normal.     Left Ear: Tympanic membrane normal.     Nose: Nose normal. No congestion.     Mouth/Throat:     Pharynx: No oropharyngeal exudate or posterior oropharyngeal erythema.  Eyes:     Conjunctiva/sclera: Conjunctivae normal.  Cardiovascular:     Rate and Rhythm: Normal rate and regular rhythm.     Heart sounds: Normal heart sounds.  Pulmonary:     Effort: Pulmonary effort is normal.     Breath sounds: Normal breath sounds.  Skin:    General: Skin is warm.   Neurological:     Mental Status: She is alert and oriented to person, place, and time.  Psychiatric:        Attention and Perception: Attention normal.        Mood and Affect: Mood normal.        Speech: Speech normal.        Behavior: Behavior is cooperative.      No results found for any visits on 08/27/23.  Assessment & Plan    Nasal congestion  - Continue Flonase  and saline nasal rinses - Prescribe azelastine  nasal spray for additional congestion relief, recommend otc antihistamines  -     Azelastine  HCl; Place 1 spray into both nostrils 2 (two) times daily. Use in each nostril as directed  Dispense: 30 mL; Refill: 3  Mild episode of recurrent major depressive disorder (HCC) Stable, tolerating wellbutrin  , refilled -     buPROPion  HCl ER (XL); Take 1 tablet (300 mg total) by mouth daily.  Dispense: 90 tablet; Refill: 3  GAD (generalized anxiety disorder) Stable, tolerating lexapro , refilled -     Escitalopram  Oxalate; Take 1 tablet (20 mg total) by mouth daily.  Dispense: 90 tablet; Refill: 1  Migraine with aura and without status migrainosus, not intractable -     TSH; Future  Hyperglycemia -     CBC with Differential/Platelet; Future -     Comprehensive metabolic panel with GFR; Future -     Hemoglobin A1c; Future  Moderate mixed hyperlipidemia not requiring statin therapy -     Lipid panel; Future   Return in about 4 weeks (around 09/24/2023) for CPE.      Manuelita Flatness, PA-C  [facility] [phone] (phone) [phone] (fax)  [facility]

## 2023-08-30 ENCOUNTER — Ambulatory Visit: Payer: Self-pay | Attending: Obstetrics and Gynecology

## 2023-08-30 DIAGNOSIS — R293 Abnormal posture: Secondary | ICD-10-CM | POA: Insufficient documentation

## 2023-08-30 DIAGNOSIS — M62838 Other muscle spasm: Secondary | ICD-10-CM | POA: Insufficient documentation

## 2023-08-30 DIAGNOSIS — M6281 Muscle weakness (generalized): Secondary | ICD-10-CM | POA: Insufficient documentation

## 2023-08-30 DIAGNOSIS — R279 Unspecified lack of coordination: Secondary | ICD-10-CM | POA: Insufficient documentation

## 2023-08-30 NOTE — Therapy (Signed)
 OUTPATIENT PHYSICAL THERAPY FEMALE PELVIC TREATMENT   Patient Name: Claire Goodwin MRN: [id number] DOB:1966/01/13, 58 y.o., female Today's Date: 08/30/2023  END OF SESSION:  PT End of Session - 08/30/23 1149     Visit Number 6    Number of Visits 10    Date for PT Re-Evaluation 11/16/23    Authorization Type [employer]    Authorization Time Period 02889, [id number], [phone] only codes approved    Authorization - Visit Number 6    Authorization - Number of Visits 10    PT Start Time 1147    PT Stop Time 1228    PT Time Calculation (min) 41 min    Activity Tolerance Patient tolerated treatment well    Behavior During Therapy WFL for tasks assessed/performed          Past Medical History:  Diagnosis Date   Anxiety    Depression    GERD (gastroesophageal reflux disease)    History of migraine headaches    Hypertension    Strep throat    Past Surgical History:  Procedure Laterality Date   CESAREAN SECTION     CHOLECYSTECTOMY N/A 06/10/2018   Procedure: LAPAROSCOPIC CHOLECYSTECTOMY WITH INTRAOPERATIVE CHOLANGIOGRAM;  Surgeon: Ethyl Lenis, MD;  Location: [facility];  Service: General;  Laterality: N/A;   ERCP N/A 06/11/2018   Procedure: ENDOSCOPIC RETROGRADE CHOLANGIOPANCREATOGRAPHY (ERCP);  Surgeon: Burnette Fallow, MD;  Location: [facility];  Service: Endoscopy;  Laterality: N/A;   REMOVAL OF STONES  06/11/2018   Procedure: REMOVAL OF STONES;  Surgeon: Burnette Fallow, MD;  Location: [facility];  Service: Endoscopy;;   SPHINCTEROTOMY  06/11/2018   Procedure: ANNETT;  Surgeon: Burnette Fallow, MD;  Location: [facility];  Service: Endoscopy;;   TONSILLECTOMY     UMBILICAL HERNIA REPAIR  06/10/2018   Procedure: HERNIA REPAIR UMBILICAL ADULT;  Surgeon: Ethyl Lenis, MD;  Location: [facility];  Service: General;;   Patient Active Problem List   Diagnosis Date Noted   Menopausal vaginal dryness 09/03/2022   Vitamin B12 deficiency 09/03/2022   Vitamin D  deficiency 09/03/2022   Cholelithiasis with chronic cholecystitis 06/09/2018   Biliary colic 06/09/2018   GAD (generalized anxiety disorder) 11/26/2012   Depression 11/26/2012   Migraine with aura 11/26/2012   Allergic rhinitis 11/26/2012   GERD (gastroesophageal reflux disease) 11/26/2012   Acne 11/26/2012    PCP: Cyndi Shaver, PA-C  REFERRING PROVIDER: Jeralyn Crutch, MD   REFERRING DIAG: N94.10 (ICD-10-CM) - Dyspareunia in female N95.8 (ICD-10-CM) - Genitourinary syndrome of menopause  THERAPY DIAG:  Muscle weakness (generalized)  Other muscle spasm  Unspecified lack of coordination  Abnormal posture  Rationale for Evaluation and Treatment: Rehabilitation  ONSET DATE: 4 years ago  SUBJECTIVE:  SUBJECTIVE STATEMENT: Pt states that intercourse is becoming more comfortable and she has some good sensation, not just pain. She does feel like she is making progress. The largest dilator that she is using is size 5.    PAIN: 08/30/23 Are you having pain? Yes NPRS scale: 2/10 at worst Pain location: Vaginal  Pain type: tight Pain description: intermittent   Aggravating factors: intercourse/vaginal penetration Relieving factors: no vaginal penetration, using estrogen  PRECAUTIONS: None  RED FLAGS: None   WEIGHT BEARING RESTRICTIONS: No  FALLS:  Has patient fallen in last 6 months? 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   PLOF: Independent  PATIENT GOALS: pain free intercourse with husband  PERTINENT HISTORY:  Umbilical hernia repair, cholecystectomy, c-section, 2 vaginal deliveries  BOWEL MOVEMENT: Pain with bowel movement: No Type of bowel movement:Frequency 1x/day usually and Strain occasional straining Fully empty rectum: No Leakage:  No Pads: No Fiber supplement/laxative No  URINATION: Pain with urination: No Fully empty bladder: Nowill have post-void dribbling Stream: sometimes starts and stops - goes to the side  Urgency: No - but mild sensation that she has to go all the time Frequency: every 3 hours during the day; couple of times at night Leakage: Coughing and Sneezing Pads: No  INTERCOURSE:  Ability to have vaginal penetration Yes  Pain with intercourse: Initial Penetration, During Penetration, and Deep Penetration - mostly with deeper penetration Dryness: not nearly as dry now Climax: as not happened for a little while  Marinoff Scale: 1/3 Lubricant: yes - astroglide   PREGNANCY: Vaginal deliveries 2 Tearing Yes:   Episiotomy Yes  C-section deliveries 1 Currently pregnant No  PROLAPSE: Maybe - she is not quite sure   OBJECTIVE:  Note: Objective measures were completed at Evaluation unless otherwise noted.  06/01/23:  PATIENT SURVEYS:   PFIQ-7: 48  COGNITION: Overall cognitive status: Within functional limits for tasks assessed     SENSATION: Light touch: Appears intact   FUNCTIONAL TESTS:  Squat: unstable Single leg stance:  Rt: pelvic drop, unstable  Lt: pelvic drop, unstable Curl-up test: abdominal distortion throughout midline   GAIT: Assistive device utilized: None Comments: WNL  POSTURE: rounded shoulders, forward head, increased lumbar lordosis, increased thoracic kyphosis, and anterior pelvic tilt   LUMBARAROM/PROM:  A/PROM A/PROM  Eval (% available)  Flexion 100  Extension 25  Right lateral flexion WNL  Left lateral flexion WNL  Right rotation 50  Left rotation 50   (Blank rows = not tested)   PALPATION:   General: WNL  Pelvic Alignment: WNL  Abdominal: very tight and tender, decreased rib cage mobility, apical breathing pattern                External Perineal Exam: dry, decreased tissue tone, areas of redness at posterior fourchette and urethra                              Internal Pelvic Floor: very tender; burning around introitus, more notable on Rt  Patient confirms identification and approves PT to assess internal pelvic floor and treatment Yes  PELVIC MMT:   MMT eval  Vaginal 1/5, 3 second hold, 6 repeat contractions in 10 seconds  Diastasis Recti WNL, but distortion throughout midline  (Blank rows = not tested)        TONE: high  PROLAPSE: Grade 1 anterior vaginal wall laxiy in supine (morning)  TODAY'S TREATMENT:  DATE:  08/30/23 Manual: Pt provides verbal consent for internal vaginal/rectal pelvic floor exam. Internal pelvic floor muscle release vaginally in supine Vaginal trigger point release in bil levator ani, Rt>Lt Neuromuscular re-education: Pelvic floor muscle desensitization  Diaphragmatic breathing for improved pelvic floor muscle relaxation/circulation Pelvic floor muscle A/ROM with breath coordination for improved circulation of proprioception   08/19/23 Manual: Pt provides verbal consent for internal vaginal/rectal pelvic floor exam. Internal pelvic floor muscle release vaginally in supine Vaginal trigger point release in bil levator ani, Rt>Lt Neuromuscular re-education: Pelvic floor muscle desensitization  Diaphragmatic breathing for improved pelvic floor muscle relaxation/circulation  08/12/23 Manual: Pt provides verbal consent for internal vaginal/rectal pelvic floor exam. Internal pelvic floor muscle release vaginally in supine Vaginal trigger point release in bil levator ani, Rt>Lt Neuromuscular re-education: Pelvic floor muscle desensitization  Diaphragmatic breathing for improved pelvic floor muscle relaxation/circulation Therapeutic activities: Dilator progress - activities to help increase friction to better prepare for comfortable intercourse   PATIENT  EDUCATION:  Education details: See above Person educated: Patient Education method: Programmer, multimedia, Demonstration, Tactile cues, Verbal cues, and Handouts Education comprehension: verbalized understanding  HOME EXERCISE PROGRAM: [id number]  ASSESSMENT:  CLINICAL IMPRESSION: Pt doing very well overall with pain reduction in intercourse from 8/10 to 2/10 as of this week. She is working with the size 5 dilator with good tolerance. Believe that working into more of her pelvic floor muscle tension as hypersensitivity is improving has been very helpful. We returned to levator ani triger point release today, still noting that Rt trigger points >Lt. She does maintain improvements from last session without overall less pelvic floor muscle restriction. Good improvements throughout session today. She will continue to benefit from skilled PT intervention in order to decrease dyspareunia, improve pelvic floor muscle spasm, begin and progress dilator program, decrease abdominal restriction, improve pressure management and deep core bracing, and progress functional strengthening program.   OBJECTIVE IMPAIRMENTS: decreased activity tolerance, decreased coordination, decreased endurance, decreased mobility, decreased ROM, decreased strength, hypomobility, increased fascial restrictions, increased muscle spasms, impaired flexibility, impaired tone, postural dysfunction, and pain.   ACTIVITY LIMITATIONS: continence  PARTICIPATION LIMITATIONS: interpersonal relationship  PERSONAL FACTORS: 1 comorbidity: medical history are also affecting patient's functional outcome.   REHAB POTENTIAL: Good  CLINICAL DECISION MAKING: Stable/uncomplicated  EVALUATION COMPLEXITY: Low   GOALS: Goals reviewed with patient? Yes  SHORT TERM GOALS: Updated 08/30/23    Pt will be independent with HEP.   Baseline: Goal status: MET 08/30/23  2.  Pt will report 25% improvement in pain with intercourse to enjoy intimate activities  with partner.  Baseline:  Goal status: MET 08/30/23  3.  Pt will be independent with diaphragmatic breathing and down training activities in order to improve pelvic floor relaxation.  Baseline:  Goal status: MET 08/30/23  4.  Pt will be able to teach back and utilize urge suppression technique in order to help reduce urgency during the day.    Baseline:  Goal status: MET 08/30/23  5.  Pt will be independent with double voiding technique in order to completely empty bladder so she reduced sensation of needing to void throughout the day.  Baseline:  Goal status: MET 08/30/23  6.  Pt will begin working with dilators to help improve vaginal sensitization and relax pelvic floor muscles in order to help return to more comfortable intercourse.  Baseline:  Goal status: MET 08/30/23  LONG TERM GOALS: Updated 08/30/23  Pt will be independent with advanced HEP.   Baseline:  Goal status:  IN PROGRESS 08/30/23  2.  Pt will report 75% improvement in pain with intercourse to enjoy intimate activities with partner.  Baseline:  Goal status: IN PROGRESS 08/30/23  3.  Pt will demonstrate 3/5 pelvic floor muscle strength with full A/ROM in order to provide good mobility and circulation in the pelvic floor muscles to improve pain with intercourse.  Baseline:  Goal status: IN PROGRESS 08/30/23  4.  Pt will be independent with dilator progression in order to help return to more comfortable intercourse.  Baseline:  Goal status: IN PROGRESS 08/30/23  5.  Pt will report no episodes of post void dribbling or stress incontinence in order to improve personal hygiene and confidence at work.  Baseline:  Goal status: IN PROGRESS 08/30/23    PLAN:  PT FREQUENCY: 1-2x/week  PT DURATION: 6 months  PLANNED INTERVENTIONS: 97110-Therapeutic exercises, 97530- Therapeutic activity, 97112- Neuromuscular re-education, 97535- Self Care, 02859- Manual therapy, Dry Needling, and Biofeedback  PLAN FOR NEXT SESSION:  Education on double voiding, urge drill, the knack; progress mobility and down training; diaphragmatic breathing with weight/theraband; core training   Josette Mares, PT, DPT08/11/2510:27 PM

## 2023-09-07 ENCOUNTER — Ambulatory Visit: Payer: PRIVATE HEALTH INSURANCE

## 2023-09-13 ENCOUNTER — Ambulatory Visit: Payer: PRIVATE HEALTH INSURANCE

## 2023-09-13 DIAGNOSIS — M6281 Muscle weakness (generalized): Secondary | ICD-10-CM | POA: Diagnosis not present

## 2023-09-13 DIAGNOSIS — R279 Unspecified lack of coordination: Secondary | ICD-10-CM

## 2023-09-13 DIAGNOSIS — M62838 Other muscle spasm: Secondary | ICD-10-CM

## 2023-09-13 DIAGNOSIS — R293 Abnormal posture: Secondary | ICD-10-CM

## 2023-09-13 NOTE — Therapy (Signed)
 OUTPATIENT PHYSICAL THERAPY FEMALE PELVIC TREATMENT   Patient Name: Claire Goodwin MRN: [id number] DOB:1965/10/14, 58 y.o., female Today's Date: 09/13/2023  END OF SESSION:  PT End of Session - 09/13/23 1621     Visit Number 7    Number of Visits 10    Date for PT Re-Evaluation 11/16/23    Authorization Type [employer]    Authorization Time Period 02889, 97140, [phone] only codes approved    Authorization - Visit Number 7    Authorization - Number of Visits 10    PT Start Time 1617    PT Stop Time 1655    PT Time Calculation (min) 38 min    Activity Tolerance Patient tolerated treatment well    Behavior During Therapy WFL for tasks assessed/performed          Past Medical History:  Diagnosis Date   Anxiety    Depression    GERD (gastroesophageal reflux disease)    History of migraine headaches    Hypertension    Strep throat    Past Surgical History:  Procedure Laterality Date   CESAREAN SECTION     CHOLECYSTECTOMY N/A 06/10/2018   Procedure: LAPAROSCOPIC CHOLECYSTECTOMY WITH INTRAOPERATIVE CHOLANGIOGRAM;  Surgeon: Ethyl Lenis, MD;  Location: [facility];  Service: General;  Laterality: N/A;   ERCP N/A 06/11/2018   Procedure: ENDOSCOPIC RETROGRADE CHOLANGIOPANCREATOGRAPHY (ERCP);  Surgeon: Burnette Fallow, MD;  Location: [facility];  Service: Endoscopy;  Laterality: N/A;   REMOVAL OF STONES  06/11/2018   Procedure: REMOVAL OF STONES;  Surgeon: Burnette Fallow, MD;  Location: [facility];  Service: Endoscopy;;   SPHINCTEROTOMY  06/11/2018   Procedure: ANNETT;  Surgeon: Burnette Fallow, MD;  Location: [facility];  Service: Endoscopy;;   TONSILLECTOMY     UMBILICAL HERNIA REPAIR  06/10/2018   Procedure: HERNIA REPAIR UMBILICAL ADULT;  Surgeon: Ethyl Lenis, MD;  Location: [facility];  Service: General;;   Patient Active Problem List   Diagnosis Date Noted   Menopausal vaginal dryness 09/03/2022   Vitamin B12 deficiency 09/03/2022   Vitamin D  deficiency 09/03/2022   Cholelithiasis with chronic cholecystitis 06/09/2018   Biliary colic 06/09/2018   GAD (generalized anxiety disorder) 11/26/2012   Depression 11/26/2012   Migraine with aura 11/26/2012   Allergic rhinitis 11/26/2012   GERD (gastroesophageal reflux disease) 11/26/2012   Acne 11/26/2012    PCP: Cyndi Shaver, PA-C  REFERRING PROVIDER: Jeralyn Crutch, MD   REFERRING DIAG: N94.10 (ICD-10-CM) - Dyspareunia in female N95.8 (ICD-10-CM) - Genitourinary syndrome of menopause  THERAPY DIAG:  Muscle weakness (generalized)  Other muscle spasm  Unspecified lack of coordination  Abnormal posture  Rationale for Evaluation and Treatment: Rehabilitation  ONSET DATE: 4 years ago  SUBJECTIVE:  SUBJECTIVE STATEMENT: Pt states that she was using dilators while talking to mom and sister on the phone and had the dilator shoot right out. She is between sizes 5 and 6.    PAIN: 09/13/23 Are you having pain? Yes NPRS scale: 2/10 at worst Pain location: Vaginal  Pain type: tight Pain description: intermittent   Aggravating factors: intercourse/vaginal penetration Relieving factors: no vaginal penetration, using estrogen  PRECAUTIONS: None  RED FLAGS: None   WEIGHT BEARING RESTRICTIONS: No  FALLS:  Has patient fallen in last 6 months? 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   PLOF: Independent  PATIENT GOALS: pain free intercourse with husband  PERTINENT HISTORY:  Umbilical hernia repair, cholecystectomy, c-section, 2 vaginal deliveries  BOWEL MOVEMENT: Pain with bowel movement: No Type of bowel movement:Frequency 1x/day usually and Strain occasional straining Fully empty rectum: No Leakage: No Pads: No Fiber supplement/laxative  No  URINATION: Pain with urination: No Fully empty bladder: Nowill have post-void dribbling Stream: sometimes starts and stops - goes to the side  Urgency: No - but mild sensation that she has to go all the time Frequency: every 3 hours during the day; couple of times at night Leakage: Coughing and Sneezing Pads: No  INTERCOURSE:  Ability to have vaginal penetration Yes  Pain with intercourse: Initial Penetration, During Penetration, and Deep Penetration - mostly with deeper penetration Dryness: not nearly as dry now Climax: as not happened for a little while  Marinoff Scale: 1/3 Lubricant: yes - astroglide   PREGNANCY: Vaginal deliveries 2 Tearing Yes:   Episiotomy Yes  C-section deliveries 1 Currently pregnant No  PROLAPSE: Maybe - she is not quite sure   OBJECTIVE:  Note: Objective measures were completed at Evaluation unless otherwise noted.  06/01/23:  PATIENT SURVEYS:   PFIQ-7: 48  COGNITION: Overall cognitive status: Within functional limits for tasks assessed     SENSATION: Light touch: Appears intact   FUNCTIONAL TESTS:  Squat: unstable Single leg stance:  Rt: pelvic drop, unstable  Lt: pelvic drop, unstable Curl-up test: abdominal distortion throughout midline   GAIT: Assistive device utilized: None Comments: WNL  POSTURE: rounded shoulders, forward head, increased lumbar lordosis, increased thoracic kyphosis, and anterior pelvic tilt   LUMBARAROM/PROM:  A/PROM A/PROM  Eval (% available)  Flexion 100  Extension 25  Right lateral flexion WNL  Left lateral flexion WNL  Right rotation 50  Left rotation 50   (Blank rows = not tested)   PALPATION:   General: WNL  Pelvic Alignment: WNL  Abdominal: very tight and tender, decreased rib cage mobility, apical breathing pattern                External Perineal Exam: dry, decreased tissue tone, areas of redness at posterior fourchette and urethra                             Internal  Pelvic Floor: very tender; burning around introitus, more notable on Rt  Patient confirms identification and approves PT to assess internal pelvic floor and treatment Yes  PELVIC MMT:   MMT eval  Vaginal 1/5, 3 second hold, 6 repeat contractions in 10 seconds  Diastasis Recti WNL, but distortion throughout midline  (Blank rows = not tested)        TONE: high  PROLAPSE: Grade 1 anterior vaginal wall laxiy in supine (morning)  TODAY'S TREATMENT:  DATE:  09/13/23 Neuromuscular re-education: Sidelying clam shell with UE ball press for transversus abdominus activation 2 x 10 bil Supine thigh press isometrics 10x bil Seated resisted rotation red band 10x bil Exercises: Open books 10x bil Therapeutic activities: Pallof press 2 x 10 bil green Standing shoulder extensions green band 2 x 10 Standing march with 3 lb shoulder flexion 2 x 10   08/30/23 Manual: Pt provides verbal consent for internal vaginal/rectal pelvic floor exam. Internal pelvic floor muscle release vaginally in supine Vaginal trigger point release in bil levator ani, Rt>Lt Neuromuscular re-education: Pelvic floor muscle desensitization  Diaphragmatic breathing for improved pelvic floor muscle relaxation/circulation Pelvic floor muscle A/ROM with breath coordination for improved circulation of proprioception   08/19/23 Manual: Pt provides verbal consent for internal vaginal/rectal pelvic floor exam. Internal pelvic floor muscle release vaginally in supine Vaginal trigger point release in bil levator ani, Rt>Lt Neuromuscular re-education: Pelvic floor muscle desensitization  Diaphragmatic breathing for improved pelvic floor muscle relaxation/circulation   PATIENT EDUCATION:  Education details: See above Person educated: Patient Education method: Explanation, Demonstration, Tactile  cues, Verbal cues, and Handouts Education comprehension: verbalized understanding  HOME EXERCISE PROGRAM: [id number]  ASSESSMENT:  CLINICAL IMPRESSION: Pt doing very well dilator progression. She is now starting to work on the 6th dilator. Pt states that intercourse with partner continues to improve. Due to progress, we decided to progress strengthening and mobility exercises. She did very well with all activities, demonstrating improved postural control. HEP updated. She will continue to benefit from skilled PT intervention in order to decrease dyspareunia, improve pelvic floor muscle spasm, begin and progress dilator program, decrease abdominal restriction, improve pressure management and deep core bracing, and progress functional strengthening program.   OBJECTIVE IMPAIRMENTS: decreased activity tolerance, decreased coordination, decreased endurance, decreased mobility, decreased ROM, decreased strength, hypomobility, increased fascial restrictions, increased muscle spasms, impaired flexibility, impaired tone, postural dysfunction, and pain.   ACTIVITY LIMITATIONS: continence  PARTICIPATION LIMITATIONS: interpersonal relationship  PERSONAL FACTORS: 1 comorbidity: medical history are also affecting patient's functional outcome.   REHAB POTENTIAL: Good  CLINICAL DECISION MAKING: Stable/uncomplicated  EVALUATION COMPLEXITY: Low   GOALS: Goals reviewed with patient? Yes  SHORT TERM GOALS: Updated 08/30/23    Pt will be independent with HEP.   Baseline: Goal status: MET 08/30/23  2.  Pt will report 25% improvement in pain with intercourse to enjoy intimate activities with partner.  Baseline:  Goal status: MET 08/30/23  3.  Pt will be independent with diaphragmatic breathing and down training activities in order to improve pelvic floor relaxation.  Baseline:  Goal status: MET 08/30/23  4.  Pt will be able to teach back and utilize urge suppression technique in order to help  reduce urgency during the day.    Baseline:  Goal status: MET 08/30/23  5.  Pt will be independent with double voiding technique in order to completely empty bladder so she reduced sensation of needing to void throughout the day.  Baseline:  Goal status: MET 08/30/23  6.  Pt will begin working with dilators to help improve vaginal sensitization and relax pelvic floor muscles in order to help return to more comfortable intercourse.  Baseline:  Goal status: MET 08/30/23  LONG TERM GOALS: Updated 08/30/23  Pt will be independent with advanced HEP.   Baseline:  Goal status: IN PROGRESS 08/30/23  2.  Pt will report 75% improvement in pain with intercourse to enjoy intimate activities with partner.  Baseline:  Goal status: IN PROGRESS 08/30/23  3.  Pt will demonstrate 3/5 pelvic floor muscle strength with full A/ROM in order to provide good mobility and circulation in the pelvic floor muscles to improve pain with intercourse.  Baseline:  Goal status: IN PROGRESS 08/30/23  4.  Pt will be independent with dilator progression in order to help return to more comfortable intercourse.  Baseline:  Goal status: IN PROGRESS 08/30/23  5.  Pt will report no episodes of post void dribbling or stress incontinence in order to improve personal hygiene and confidence at work.  Baseline:  Goal status: IN PROGRESS 08/30/23    PLAN:  PT FREQUENCY: 1-2x/week  PT DURATION: 6 months  PLANNED INTERVENTIONS: 97110-Therapeutic exercises, 97530- Therapeutic activity, 97112- Neuromuscular re-education, 97535- Self Care, 02859- Manual therapy, Dry Needling, and Biofeedback  PLAN FOR NEXT SESSION: Education on double voiding, urge drill, the knack; progress mobility and down training; diaphragmatic breathing with weight/theraband; core training   Josette Mares, PT, DPT08/25/255:13 PM

## 2023-09-22 ENCOUNTER — Other Ambulatory Visit: Payer: Self-pay | Admitting: Family

## 2023-09-22 ENCOUNTER — Telehealth: Payer: Self-pay

## 2023-09-22 DIAGNOSIS — F330 Major depressive disorder, recurrent, mild: Secondary | ICD-10-CM

## 2023-09-22 MED ORDER — BUPROPION HCL ER (XL) 300 MG PO TB24: 300.0000 mg | ORAL_TABLET | Freq: Every day | ORAL | 1 refills | Status: DC

## 2023-09-22 NOTE — Telephone Encounter (Signed)
 [facility] w/ [facility] called requesting a refill request for patient on the Wellbutrin  XL 300 MG and medication was send into pharmacy under Dr. Frann name since he is the doctor of the day and also patinet has upcoming appointment with him on 10/01/2023.

## 2023-09-27 ENCOUNTER — Other Ambulatory Visit (INDEPENDENT_AMBULATORY_CARE_PROVIDER_SITE_OTHER): Payer: PRIVATE HEALTH INSURANCE

## 2023-09-27 DIAGNOSIS — G43109 Migraine with aura, not intractable, without status migrainosus: Secondary | ICD-10-CM | POA: Diagnosis not present

## 2023-09-27 DIAGNOSIS — E782 Mixed hyperlipidemia: Secondary | ICD-10-CM | POA: Diagnosis not present

## 2023-09-27 DIAGNOSIS — R739 Hyperglycemia, unspecified: Secondary | ICD-10-CM | POA: Diagnosis not present

## 2023-09-27 LAB — COMPREHENSIVE METABOLIC PANEL WITH GFR
ALT: 12 U/L (ref 0–35)
AST: 16 U/L (ref 0–37)
Albumin: 4 g/dL (ref 3.5–5.2)
Alkaline Phosphatase: 73 U/L (ref 39–117)
BUN: 20 mg/dL (ref 6–23)
CO2: 24 meq/L (ref 19–32)
Calcium: 9.3 mg/dL (ref 8.4–10.5)
Chloride: 108 meq/L (ref 96–112)
Creatinine, Ser: 0.83 mg/dL (ref 0.40–1.20)
GFR: 76.81 mL/min (ref >60.00)
Glucose, Bld: 82 mg/dL (ref 70–99)
Potassium: 4.4 meq/L (ref 3.5–5.1)
Sodium: 140 meq/L (ref 135–145)
Total Bilirubin: 0.4 mg/dL (ref 0.2–1.2)
Total Protein: 6.4 g/dL (ref 6.0–8.3)

## 2023-09-27 LAB — CBC WITH DIFFERENTIAL/PLATELET
Basophils Absolute: 0.1 K/uL (ref 0.0–0.1)
Basophils Relative: 1.1 % (ref 0.0–3.0)
Eosinophils Absolute: 0.3 K/uL (ref 0.0–0.7)
Eosinophils Relative: 4.9 % (ref 0.0–5.0)
HCT: 42.1 % (ref 36.0–46.0)
Hemoglobin: 13.9 g/dL (ref 12.0–15.0)
Lymphocytes Relative: 39.5 % (ref 12.0–46.0)
Lymphs Abs: 2.8 K/uL (ref 0.7–4.0)
MCHC: 33 g/dL (ref 30.0–36.0)
MCV: 89 fl (ref 78.0–100.0)
Monocytes Absolute: 0.6 K/uL (ref 0.1–1.0)
Monocytes Relative: 8.6 % (ref 3.0–12.0)
Neutro Abs: 3.3 K/uL (ref 1.4–7.7)
Neutrophils Relative %: 45.9 % (ref 43.0–77.0)
Platelets: 321 K/uL (ref 150.0–400.0)
RBC: 4.73 Mil/uL (ref 3.87–5.11)
RDW: 13.1 % (ref 11.5–15.5)
WBC: 7.1 K/uL (ref 4.0–10.5)

## 2023-09-27 LAB — LIPID PANEL
Cholesterol: 236 mg/dL — ABNORMAL HIGH (ref 0–200)
HDL: 61.5 mg/dL (ref >39.00)
LDL Cholesterol: 144 mg/dL — ABNORMAL HIGH (ref 0–99)
NonHDL: 174.23
Total CHOL/HDL Ratio: 4
Triglycerides: 150 mg/dL — ABNORMAL HIGH (ref 0.0–149.0)
VLDL: 30 mg/dL (ref 0.0–40.0)

## 2023-09-27 LAB — HEMOGLOBIN A1C: Hgb A1c MFr Bld: 5.8 % (ref 4.6–6.5)

## 2023-09-27 LAB — TSH: TSH: 2.02 u[IU]/mL (ref 0.35–5.50)

## 2023-09-28 ENCOUNTER — Ambulatory Visit (INDEPENDENT_AMBULATORY_CARE_PROVIDER_SITE_OTHER): Payer: PRIVATE HEALTH INSURANCE

## 2023-09-28 ENCOUNTER — Ambulatory Visit
Admission: EM | Admit: 2023-09-28 | Discharge: 2023-09-28 | Disposition: A | Discharge status: Home / Self Care | Payer: PRIVATE HEALTH INSURANCE | Source: Ambulatory Visit | Attending: Family Medicine | Admitting: Family Medicine

## 2023-09-28 DIAGNOSIS — Z23 Encounter for immunization: Secondary | ICD-10-CM

## 2023-09-28 DIAGNOSIS — M25531 Pain in right wrist: Secondary | ICD-10-CM | POA: Diagnosis not present

## 2023-09-28 DIAGNOSIS — W19XXXA Unspecified fall, initial encounter: Secondary | ICD-10-CM

## 2023-09-28 DIAGNOSIS — S0083XA Contusion of other part of head, initial encounter: Secondary | ICD-10-CM

## 2023-09-28 DIAGNOSIS — S60211A Contusion of right wrist, initial encounter: Secondary | ICD-10-CM

## 2023-09-28 MED ORDER — PREDNISONE 20 MG PO TABS: 20.0000 mg | ORAL_TABLET | Freq: Every day | ORAL | 0 refills | Status: DC

## 2023-09-28 MED ORDER — TETANUS-DIPHTH-ACELL PERTUSSIS 5-2.5-18.5 LF-MCG/0.5 IM SUSY
0.5000 mL | PREFILLED_SYRINGE | Freq: Once | INTRAMUSCULAR | Status: AC
  Administered 2023-09-28: 0.5 mL via INTRAMUSCULAR
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
## 2023-09-30 ENCOUNTER — Ambulatory Visit: Payer: PRIVATE HEALTH INSURANCE

## 2023-10-01 ENCOUNTER — Encounter: Payer: Self-pay | Admitting: Family Medicine

## 2023-10-01 ENCOUNTER — Ambulatory Visit (INDEPENDENT_AMBULATORY_CARE_PROVIDER_SITE_OTHER): Payer: PRIVATE HEALTH INSURANCE | Admitting: Family Medicine

## 2023-10-01 VITALS — BP 124/60 | HR 110 | Temp 98.0°F | Resp 18 | Ht <= 58 in | Wt 120.8 lb

## 2023-10-01 DIAGNOSIS — E559 Vitamin D deficiency, unspecified: Secondary | ICD-10-CM

## 2023-10-01 DIAGNOSIS — E538 Deficiency of other specified B group vitamins: Secondary | ICD-10-CM | POA: Diagnosis not present

## 2023-10-01 DIAGNOSIS — Z1159 Encounter for screening for other viral diseases: Secondary | ICD-10-CM

## 2023-10-01 DIAGNOSIS — R0981 Nasal congestion: Secondary | ICD-10-CM

## 2023-10-01 DIAGNOSIS — F330 Major depressive disorder, recurrent, mild: Secondary | ICD-10-CM

## 2023-10-01 DIAGNOSIS — Z0000 Encounter for general adult medical examination without abnormal findings: Secondary | ICD-10-CM

## 2023-10-01 DIAGNOSIS — F411 Generalized anxiety disorder: Secondary | ICD-10-CM

## 2023-10-01 MED ORDER — AZELASTINE HCL 0.1 % NA SOLN: 1.0000 | Freq: Two times a day (BID) | NASAL | 3 refills | Status: AC

## 2023-10-01 MED ORDER — ESCITALOPRAM OXALATE 20 MG PO TABS: 20.0000 mg | ORAL_TABLET | Freq: Every day | ORAL | 3 refills | Status: DC

## 2023-10-01 MED ORDER — BUPROPION HCL ER (XL) 300 MG PO TB24
300.0000 mg | ORAL_TABLET | Freq: Every day | ORAL | 3 refills | Status: AC
Start: 2023-10-01 — End: ?
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
## 2023-10-01 NOTE — Patient Instructions (Addendum)
 Please get your flu shot, shingles shot and pneumonia vaccine after your test.  Keep the diet clean and stay active.  Give us  2-3 business days to get the results of your labs back.   Let us  know if you need anything.

## 2023-10-02 LAB — MAGNESIUM: Magnesium: 2.1 mg/dL (ref 1.5–2.5)

## 2023-10-02 LAB — VITAMIN D 25 HYDROXY (VIT D DEFICIENCY, FRACTURES): Vit D, 25-Hydroxy: 37 ng/mL (ref 30–100)

## 2023-10-02 LAB — HEPATITIS C ANTIBODY: Hepatitis C Ab: NONREACTIVE

## 2023-10-02 LAB — EXTRA LAV TOP TUBE

## 2023-10-02 LAB — VITAMIN B12: Vitamin B-12: 523 pg/mL (ref 200–1100)

## 2023-10-03 ENCOUNTER — Ambulatory Visit: Payer: Self-pay | Admitting: Family Medicine

## 2023-10-07 ENCOUNTER — Ambulatory Visit: Payer: PRIVATE HEALTH INSURANCE | Attending: Obstetrics and Gynecology

## 2023-10-07 DIAGNOSIS — M62838 Other muscle spasm: Secondary | ICD-10-CM | POA: Insufficient documentation

## 2023-10-07 DIAGNOSIS — R293 Abnormal posture: Secondary | ICD-10-CM | POA: Insufficient documentation

## 2023-10-07 DIAGNOSIS — M6281 Muscle weakness (generalized): Secondary | ICD-10-CM | POA: Insufficient documentation

## 2023-10-07 DIAGNOSIS — R279 Unspecified lack of coordination: Secondary | ICD-10-CM | POA: Insufficient documentation
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
## 2023-10-11 ENCOUNTER — Other Ambulatory Visit: Payer: Self-pay | Admitting: Family Medicine

## 2023-10-12 ENCOUNTER — Other Ambulatory Visit: Payer: Self-pay | Admitting: Family Medicine

## 2023-10-12 MED ORDER — ESCITALOPRAM OXALATE 10 MG PO TABS: 10.0000 mg | ORAL_TABLET | Freq: Every day | ORAL | 1 refills | Status: AC

## 2023-10-29 ENCOUNTER — Telehealth: Payer: PRIVATE HEALTH INSURANCE | Admitting: Physician Assistant

## 2023-10-29 DIAGNOSIS — L710 Perioral dermatitis: Secondary | ICD-10-CM | POA: Diagnosis not present

## 2023-10-29 MED ORDER — METRONIDAZOLE 0.75 % EX CREA: TOPICAL_CREAM | Freq: Two times a day (BID) | CUTANEOUS | 0 refills | Status: DC

## 2023-10-29 NOTE — Patient Instructions (Signed)
 Claire Goodwin, thank you for joining Delon CHRISTELLA Dickinson, PA-C for today's virtual visit.  While this provider is not your primary care provider (PCP), if your PCP is located in our provider database this encounter information will be shared with them immediately following your visit.   A [facility] MyChart account gives you access to today's visit and all your visits, tests, and labs performed at [facility]  click here if you don't have a [facility] MyChart account or go to [email or web address]  Consent: (Patient) Claire Goodwin provided verbal consent for this virtual visit at the beginning of the encounter.  Current Medications:  Current Outpatient Medications:    metroNIDAZOLE (METROCREAM) 0.75 % cream, Apply topically 2 (two) times daily., Disp: 45 g, Rfl: 0   azelastine  (ASTELIN ) 0.1 % nasal spray, Place 1 spray into both nostrils 2 (two) times daily. Use in each nostril as directed, Disp: 90 mL, Rfl: 3   buPROPion  (WELLBUTRIN  XL) 300 MG 24 hr tablet, Take 1 tablet (300 mg total) by mouth daily., Disp: 90 tablet, Rfl: 3   cyanocobalamin  (VITAMIN B12) 1000 MCG/ML injection, Inject 1 mL (1,000 mcg total) into the muscle every 30 (thirty) days., Disp: 10 mL, Rfl: 1   cyclobenzaprine (FLEXERIL) 5 MG tablet, Take by mouth., Disp: , Rfl:    escitalopram  (LEXAPRO ) 10 MG tablet, Take 1 tablet (10 mg total) by mouth daily., Disp: 90 tablet, Rfl: 1   Estradiol  10 MCG TABS vaginal tablet, Place 1 tablet (10 mcg total) vaginally 2 (two) times a week. Administer nightly for 2 weeks and then twice weekly thereafter, Disp: 30 tablet, Rfl: 12   fluticasone  (FLONASE ) 50 MCG/ACT nasal spray, Place 2 sprays into both nostrils daily., Disp: 17 g, Rfl: 11   Multiple Vitamin (MULTIVITAMIN) tablet, Take 1 tablet by mouth daily., Disp: , Rfl:    Medications ordered in this encounter:  Meds ordered this encounter  Medications   metroNIDAZOLE (METROCREAM) 0.75 % cream    Sig: Apply  topically 2 (two) times daily.    Dispense:  45 g    Refill:  0    Supervising Provider:   BLAISE ALEENE KIDD [[id number]]     *If you need refills on other medications prior to your next appointment, please contact your pharmacy*  Follow-Up: Call back or seek an in-person evaluation if the symptoms worsen or if the condition fails to improve as anticipated.   Virtual Care [phone]  Other Instructions Perioral dermatitis  Perioral dermatitis is an eruption which is usually located around the mouth and nose.  It can be a rash and/or red bumps.  It occasionally occurs around the eyes.  It may be itchy and may burn.  The exact cause is unknown.  Some types of makeup, moisturizers, dental products, and prescription creams may be partially responsible for the eruption.  Topical steroids such as cortisone creams can temporarily make the rash better but with discontinuation the rash tends to recur and worsen.  If you have been using topical steroids, your dermatologist may need to gradually taper the strength of steroids.  Topical antibiotics, elidel cream, protopic ointment, and oral antiobiotics may be prescribed to treat this condition.  Although perioral dermatitis is not an infection, some antibiotics have anti-inflammatory properties that help it greatly.    If you have been instructed to have an in-person evaluation today at a local Urgent Care facility, please use the link below. It will take you to a list of all  of our available [facility] Urgent Cares, including address, phone number and hours of operation. Please do not delay care.  [facility]  If you or a family member do not have a primary care provider, use the link below to schedule a visit and establish care. When you choose a [facility] primary care physician or advanced practice provider, you gain a long-term partner in health. Find a Primary Care Provider  Learn more about [facility]'s in-office and  virtual care options: [facility] - Get Care Now

## 2023-10-29 NOTE — Progress Notes (Signed)
 Virtual Visit Consent   Claire Goodwin, you are scheduled for a virtual visit with a [facility] provider today. Just as with appointments in the office, your consent must be obtained to participate. Your consent will be active for this visit and any virtual visit you may have with one of our providers in the next 365 days. If you have a MyChart account, a copy of this consent can be sent to you electronically.  As this is a virtual visit, video technology does not allow for your provider to perform a traditional examination. This may limit your provider's ability to fully assess your condition. If your provider identifies any concerns that need to be evaluated in person or the need to arrange testing (such as labs, EKG, etc.), we will make arrangements to do so. Although advances in technology are sophisticated, we cannot ensure that it will always work on either your end or our end. If the connection with a video visit is poor, the visit may have to be switched to a telephone visit. With either a video or telephone visit, we are not always able to ensure that we have a secure connection.  By engaging in this virtual visit, you consent to the provision of healthcare and authorize for your insurance to be billed (if applicable) for the services provided during this visit. Depending on your insurance coverage, you may receive a charge related to this service.  I need to obtain your verbal consent now. Are you willing to proceed with your visit today? Claire Goodwin has provided verbal consent on 10/29/2023 for a virtual visit (video or telephone). Delon CHRISTELLA Dickinson, PA-C  Date: 10/29/2023 2:26 PM   Virtual Visit via Video Note   I, Delon CHRISTELLA Dickinson, connected with  Claire Goodwin  ([id number], 1965-07-12) on 10/29/23 at  2:15 PM EDT by a video-enabled telemedicine application and verified that I am speaking with the correct person using two identifiers.  Location: Patient: Virtual Visit  Location Patient: Home Provider: Virtual Visit Location Provider: Home Office   I discussed the limitations of evaluation and management by telemedicine and the availability of in person appointments. The patient expressed understanding and agreed to proceed.    History of Present Illness: Claire Goodwin is a 58 y.o. who identifies as a female who was assigned female at birth, and is being seen today for rash on face.  HPI: Rash This is a new problem. The current episode started 1 to 4 weeks ago. The problem has been gradually worsening since onset. The affected locations include the face. The rash is characterized by blistering, redness and draining. She was exposed to nothing. Pertinent negatives include no congestion, cough, facial edema, fatigue, fever, joint pain or shortness of breath. Treatments tried: salicyclic acid, sulfur compound, hydrogen peroxide and alcohol, topical antifungal. The treatment provided no relief.     Problems:  Patient Active Problem List   Diagnosis Date Noted   Menopausal vaginal dryness 09/03/2022   Vitamin B12 deficiency 09/03/2022   Vitamin D  deficiency 09/03/2022   Cholelithiasis with chronic cholecystitis 06/09/2018   Biliary colic 06/09/2018   GAD (generalized anxiety disorder) 11/26/2012   Depression 11/26/2012   Migraine with aura 11/26/2012   Allergic rhinitis 11/26/2012   GERD (gastroesophageal reflux disease) 11/26/2012   Acne 11/26/2012    Allergies:  Allergies  Allergen Reactions   Cephalosporins Rash   Sulfa Antibiotics Rash   Medications:  Current Outpatient Medications:    metroNIDAZOLE (METROCREAM) 0.75 %  cream, Apply topically 2 (two) times daily., Disp: 45 g, Rfl: 0   azelastine  (ASTELIN ) 0.1 % nasal spray, Place 1 spray into both nostrils 2 (two) times daily. Use in each nostril as directed, Disp: 90 mL, Rfl: 3   buPROPion  (WELLBUTRIN  XL) 300 MG 24 hr tablet, Take 1 tablet (300 mg total) by mouth daily., Disp: 90 tablet, Rfl:  3   cyanocobalamin  (VITAMIN B12) 1000 MCG/ML injection, Inject 1 mL (1,000 mcg total) into the muscle every 30 (thirty) days., Disp: 10 mL, Rfl: 1   cyclobenzaprine (FLEXERIL) 5 MG tablet, Take by mouth., Disp: , Rfl:    escitalopram  (LEXAPRO ) 10 MG tablet, Take 1 tablet (10 mg total) by mouth daily., Disp: 90 tablet, Rfl: 1   Estradiol  10 MCG TABS vaginal tablet, Place 1 tablet (10 mcg total) vaginally 2 (two) times a week. Administer nightly for 2 weeks and then twice weekly thereafter, Disp: 30 tablet, Rfl: 12   fluticasone  (FLONASE ) 50 MCG/ACT nasal spray, Place 2 sprays into both nostrils daily., Disp: 17 g, Rfl: 11   Multiple Vitamin (MULTIVITAMIN) tablet, Take 1 tablet by mouth daily., Disp: , Rfl:   Observations/Objective: Patient is well-developed, well-nourished in no acute distress.  Resting comfortably at home.  Head is normocephalic, atraumatic.  No labored breathing.  Speech is clear and coherent with logical content.  Patient is alert and oriented at baseline.  Individual vesicular-like lesions with dry eschar in various healing stages on an erythematous base noted along the nasolabial fold on the left side  Assessment and Plan: 1. Perioral dermatitis (Primary) - metroNIDAZOLE (METROCREAM) 0.75 % cream; Apply topically 2 (two) times daily.  Dispense: 45 g; Refill: 0  - Suspect perioral dermatitis - Add topical metronidazole cream - Continue to clean skin with a gentle cleanser like CeraVe - Moisturize well - Follow up if not improving  Follow Up Instructions: I discussed the assessment and treatment plan with the patient. The patient was provided an opportunity to ask questions and all were answered. The patient agreed with the plan and demonstrated an understanding of the instructions.  A copy of instructions were sent to the patient via MyChart unless otherwise noted below.    The patient was advised to call back or seek an in-person evaluation if the symptoms worsen  or if the condition fails to improve as anticipated.    Delon CHRISTELLA Dickinson, PA-C

## 2023-11-10 ENCOUNTER — Encounter: Payer: Self-pay | Admitting: Physician Assistant

## 2023-12-01 ENCOUNTER — Ambulatory Visit: Payer: PRIVATE HEALTH INSURANCE | Attending: Obstetrics and Gynecology

## 2023-12-09 ENCOUNTER — Ambulatory Visit: Payer: PRIVATE HEALTH INSURANCE

## 2023-12-10 ENCOUNTER — Ambulatory Visit: Payer: PRIVATE HEALTH INSURANCE

## 2023-12-21 ENCOUNTER — Encounter: Payer: PRIVATE HEALTH INSURANCE | Admitting: Family Medicine

## 2023-12-21 DIAGNOSIS — L710 Perioral dermatitis: Secondary | ICD-10-CM

## 2023-12-22 ENCOUNTER — Other Ambulatory Visit: Payer: Self-pay | Admitting: Family Medicine

## 2023-12-22 DIAGNOSIS — E538 Deficiency of other specified B group vitamins: Secondary | ICD-10-CM

## 2023-12-23 MED ORDER — FLUCONAZOLE 150 MG PO TABS: ORAL_TABLET | ORAL | 0 refills | Status: DC

## 2023-12-23 MED ORDER — DOXYCYCLINE HYCLATE 100 MG PO TABS: 100.0000 mg | ORAL_TABLET | Freq: Two times a day (BID) | ORAL | 0 refills | Status: DC

## 2023-12-23 NOTE — Telephone Encounter (Signed)
Please see the MyChart message reply(ies) for my assessment and plan.  The patient gave consent for this Medical Advice Message and is aware that it may result in a bill to their insurance company as well as the possibility that this may result in a co-payment or deductible. They are an established patient, but are not seeking medical advice exclusively about a problem treated during an in person or video visit in the last 7 days. I did not recommend an in person or video visit within 7 days of my reply.  I spent a total of 8 minutes cumulative time within 7 days through MyChart messaging Antolin Belsito Paul Jeannelle Wiens, DO  

## 2024-01-19 ENCOUNTER — Ambulatory Visit: Payer: PRIVATE HEALTH INSURANCE | Admitting: Family Medicine

## 2024-01-19 ENCOUNTER — Encounter: Payer: Self-pay | Admitting: Family Medicine

## 2024-01-19 VITALS — BP 122/68 | HR 84 | Temp 98.0°F | Resp 16 | Ht <= 58 in

## 2024-01-19 DIAGNOSIS — G43909 Migraine, unspecified, not intractable, without status migrainosus: Secondary | ICD-10-CM

## 2024-01-19 DIAGNOSIS — Z23 Encounter for immunization: Secondary | ICD-10-CM

## 2024-01-19 DIAGNOSIS — L710 Perioral dermatitis: Secondary | ICD-10-CM | POA: Diagnosis not present

## 2024-01-19 MED ORDER — DOXYCYCLINE HYCLATE 100 MG PO TABS: 100.0000 mg | ORAL_TABLET | Freq: Two times a day (BID) | ORAL | 0 refills | Status: AC

## 2024-01-19 MED ORDER — KETOROLAC TROMETHAMINE 60 MG/2ML IM SOLN
60.0000 mg | Freq: Once | INTRAMUSCULAR | Status: AC
  Administered 2024-01-19: 60 mg via INTRAMUSCULAR

## 2024-01-19 MED ORDER — ERYTHROMYCIN 2 % EX GEL: CUTANEOUS | 1 refills | Status: AC

## 2024-01-19 MED ORDER — FLUCONAZOLE 150 MG PO TABS: ORAL_TABLET | ORAL | 0 refills | Status: AC

## 2024-01-19 NOTE — Progress Notes (Signed)
 Chief Complaint  Patient presents with   Rash    Rash and Headaches     Claire Goodwin is a 58 y.o. female here for a skin complaint.  Duration: 2 days Location: around mouth Pruritic? Yes Painful? No- can be painful Drainage? No New soaps/lotions/topicals/detergents? No Sick contacts? No Other associated symptoms: has been having stress Therapies tried thus far: metrogel , doxy; the form did not work, the latter did work She has never been on erythromycin gel.  Patient has a history of migraines.  She has been waking up with headaches.  She has a history of untreated mild sleep apnea.  Stress levels have been high.  She does not take any prophylactic or abortive therapies.  She feels a headache coming on now.  Past Medical History:  Diagnosis Date   Anxiety    Depression    GERD (gastroesophageal reflux disease)    History of migraine headaches    Hypertension    Strep throat     BP 122/68 (BP Location: Left Arm, Patient Position: Sitting)   Pulse 84   Temp 98 F (36.7 C) (Oral)   Resp 16   Ht 4' 10 (1.473 m)   LMP 07/04/2013   SpO2 97%   BMI 25.25 kg/m  Gen: awake, alert, appearing stated age Lungs: No accessory muscle use Neuro: DTRs equal and symmetric throughout, 5/5 strength throughout, gait is normal, no clonus, no cerebellar signs MSK: No TTP over the TMJ, temporalis, suboccipital triangle/cervical paraspinal musculature region Skin: Erythematous papules with some excoriation in the perioral region. No drainage, TTP, fluctuance, scaling Psych: Age appropriate judgment and insight  POD (perioral dermatitis) - Plan: fluconazole  (DIFLUCAN ) 150 MG tablet, doxycycline  (VIBRA -TABS) 100 MG tablet, erythromycin with ethanol (EMGEL) 2 % gel  Acute migraine - Plan: ketorolac  (TORADOL ) injection 60 mg  Need for pneumococcal vaccine - Plan: Pneumococcal conjugate vaccine 20-valent (Prevnar 20)  Need for influenza vaccination - Plan: Flu vaccine trivalent PF, 6mos and  older(Flulaval,Afluria,Fluarix,Fluzone)  Doxycycline  provided for acute therapy.  Erythromycin gel sent in for future recurrence. Exacerbation of chronic issue.  Single dose of Toradol  60 mg given. PCV 20 today. Flu shot today. F/u as originally scheduled. The patient voiced understanding and agreement to the plan.  [name], DO 01/19/2024 12:04 PM

## 2024-01-19 NOTE — Patient Instructions (Signed)
 Heat (pad or rice pillow in microwave) over affected area, 10-15 minutes twice daily.   Ice/cold pack over area for 10-15 min twice daily.  OK to take Tylenol 1000 mg (2 extra strength tabs) or 975 mg (3 regular strength tabs) every 6 hours as needed.  Let us know if you need anything.
